# Patient Record
Sex: Female | Born: 1980 | Race: Asian | Hispanic: No | Marital: Married | Smoking: Never smoker
Health system: Southern US, Community
[De-identification: ages and names within clinical notes are randomized; demographics above are authoritative.]

## PROBLEM LIST (undated history)

## (undated) ENCOUNTER — Inpatient Hospital Stay (HOSPITAL_COMMUNITY): Payer: Self-pay

## (undated) DIAGNOSIS — IMO0002 Reserved for concepts with insufficient information to code with codable children: Secondary | ICD-10-CM

## (undated) DIAGNOSIS — Z789 Other specified health status: Secondary | ICD-10-CM

---

## 2011-08-06 ENCOUNTER — Encounter: Payer: Self-pay | Admitting: Obstetrics & Gynecology

## 2011-08-18 ENCOUNTER — Other Ambulatory Visit: Payer: Self-pay | Admitting: Obstetrics & Gynecology

## 2011-08-18 LAB — OB RESULTS CONSOLE HIV ANTIBODY (ROUTINE TESTING): HIV: NONREACTIVE

## 2011-08-18 LAB — OB RESULTS CONSOLE HEPATITIS B SURFACE ANTIGEN: Hepatitis B Surface Ag: NEGATIVE

## 2011-08-18 LAB — OB RESULTS CONSOLE RUBELLA ANTIBODY, IGM: Rubella: IMMUNE

## 2011-08-18 LAB — OB RESULTS CONSOLE RPR: RPR: NONREACTIVE

## 2011-08-26 LAB — OB RESULTS CONSOLE GC/CHLAMYDIA
Chlamydia: NEGATIVE
Gonorrhea: NEGATIVE

## 2011-09-30 ENCOUNTER — Ambulatory Visit (HOSPITAL_COMMUNITY): Payer: Managed Care, Other (non HMO)

## 2011-10-02 ENCOUNTER — Ambulatory Visit (HOSPITAL_COMMUNITY)
Admission: RE | Admit: 2011-10-02 | Discharge: 2011-10-02 | Disposition: A | Discharge status: Home / Self Care | Payer: Managed Care, Other (non HMO) | Source: Ambulatory Visit | Attending: Obstetrics & Gynecology | Admitting: Obstetrics & Gynecology

## 2011-10-02 NOTE — Progress Notes (Signed)
Genetic Counseling  High-Risk Gestation Note  Appointment Date:  10/02/2011 Referred By: Robley Fries, MD Date of Birth:  January 29, 1981 Attending: Particia Nearing, MD   [name] was seen for genetic counseling because of a family history of a genetic condition.     Both family histories were reviewed and found to be contributory for a genetic condition in the father of the pregnancy, Mr. Burson, paternal family history. Mr. Legault paternal uncle reportedly had partial alopecia and is otherwise healthy. Mr. Quesinberry paternal aunt had one daughter who passed away at age 71 years due to more severe symptoms from reportedly the same condition, and this same paternal aunt had another daughter who is reportedly unaffected. This daughter has one daughter, currently age 26 years with similar features to the female who died at age 56. 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 She is reported to not be expected to live past 5 years, though the patient did not know the reason. She has been evaluated at [facility], and research is reportedly still being conducted. The patient stated that she was told that this relative has a genetic condition that starts with "E" and is three letters total. She also reported that it was due to a missing chromosome. No additional relatives were reported with similar features including the father of the pregnancy and his father. We discussed that based on the description of features and the description of the possible name, the condition in the family may be EEC syndrome (ectrodactyly-ectodermal dysplasia-clefting syndrome). However, we discussed that part of the description does not necessarily fit with EEC  syndrome including the condition being cause by a missing chromosome and a reduced lifespan. Additional information is needed regarding the family history to confirm the diagnosis of the condition in the family and thus inheritance and recurrence risk for relatives.  EEC syndrome is characterized by limb malformations, ectodermal dysplasia (hypotrichosis and anodontia), and cleft lip with or without palate (in approximately 68% of patients). Specifically, limb malformations include defects in the midportion of hands and feet, varying from ectrodactyly to syndactyly (~84% of patients). Additional characteristics may include: defects of lacrimal duct system (~59% of patients), characteristic facial features, genitourinary anomalies (~52% of patients), and less commonly conductive hearing loss (14%). Individuals typically have normal intelligence, though developmental delay has been reported to occur in approximately 7% of individuals. The lifespan is not typically expected to be shortened in individuals with EEC syndrome.  Variable expressivity and reduced penetrance is reported in EEC.   EEC syndrome follows autosomal dominant inheritance. We reviewed chromosomes, genes, and various patterns of inheritance, including autosomal dominant inheritance. In autosomal dominant inheritance, an affected individual has a 1 in 2 (50%) chance to pass on the condition with each offspring, regardless of gender. If an individual does not inherit the causative gene change, then they would not be at increased risk to have a child with the autosomal dominant condition in the family. Reduced penetrance describes when an individual has the causative gene change for a particular condition but does not display symptoms. However, this person still has a 1 in 2 (50%) chance to pass on the causative gene change with each pregnancy. In the case of an autosomal dominant  condition with reduced penetrance as the cause for the features in  this reported family history, recurrence risk for the current pregnancy is approximately 1 in 8 (12.5%). This recurrence risk estimate may change in the case of additional information regarding the family history, such as a different condition in the family.   At least three types of EEC syndrome have been described, with at least 3 different gene loci identified. Clinical testing is available for one of these genes, p63, which is the causative gene for the majority of cases of EEC syndrome. We discussed that genetic testing is most informative when started with an affected relative, given that genetic testing cannot identify causative mutations in all individuals with a clinical diagnosis of a condition. In the case that a causative gene change is identified (or has already been identified), then genetic testing would be available to at-risk relatives such as the father of the pregnancy, for the familial mutation. The patient did not have information at this time regarding whether or not genetic testing had been performed for the father of the pregnancy's affected cousins.   We discussed that prenatal diagnosis would be available via amniocentesis, in the event that the genetic cause (specific gene and mutation) has been identified in the family and if the pregnancy is identified to be at risk to inherit the condition. The risks, benefits, and limitations of amniocentesis were reviewed. In the case of an autosomal dominant condition and in the case that a familial mutation is identified, genetic testing would then be available to the father of the pregnancy to further adjust recurrence risk to the current pregnancy. We discussed that amniocentesis would not be informative for the genetic condition in the family at this time without confirmation of the diagnosis in the affected individuals and without information regarding an identified causative mutation. We reviewed the option of targeted ultrasound to assess  fetal growth and development and to assess for possible features of the condition in the family such as ectrodactyly/syndactyly. The patient understands that ultrasound cannot diagnose or rule out all birth defects or genetic conditions, and that ultrasound would not rule out the presence of the condition present in Mr. Dutan paternal family. The patient elected to return on 10/17/11 for targeted ultrasound. She will contact our office if additional information is obtained regarding the specific condition in the family and whether or not genetic testing has been performed and identified a causative mutation. Additional testing may be available to the patient and/or her partner depending upon the additional information obtained regarding the family history. Without further information regarding the provided family history, an accurate genetic risk cannot be calculated. Further genetic counseling is warranted if more information is obtained.  Mrs. Heron denied exposure to environmental toxins or chemical agents. She denied the use of alcohol, tobacco or street drugs. She denied significant viral illnesses during the course of her pregnancy. Her medical and surgical histories were noncontributory.   I counseled [name] regarding the above risks and available options.  The approximate face-to-face time with the genetic counselor was 35 minutes.  Quinn Plowman, MS,  Certified [employer] 10/02/2011

## 2011-10-15 ENCOUNTER — Other Ambulatory Visit (HOSPITAL_COMMUNITY): Payer: Self-pay | Admitting: Obstetrics & Gynecology

## 2011-10-15 DIAGNOSIS — Z8489 Family history of other specified conditions: Secondary | ICD-10-CM

## 2011-10-17 ENCOUNTER — Ambulatory Visit (HOSPITAL_COMMUNITY)
Admission: RE | Admit: 2011-10-17 | Discharge: 2011-10-17 | Disposition: A | Discharge status: Home / Self Care | Payer: Managed Care, Other (non HMO) | Source: Ambulatory Visit | Attending: Obstetrics & Gynecology | Admitting: Obstetrics & Gynecology

## 2011-10-17 ENCOUNTER — Encounter (HOSPITAL_COMMUNITY): Payer: Self-pay

## 2011-10-17 DIAGNOSIS — Z8489 Family history of other specified conditions: Secondary | ICD-10-CM

## 2011-10-17 DIAGNOSIS — O352XX0 Maternal care for (suspected) hereditary disease in fetus, not applicable or unspecified: Secondary | ICD-10-CM | POA: Insufficient documentation

## 2011-10-17 DIAGNOSIS — O358XX0 Maternal care for other (suspected) fetal abnormality and damage, not applicable or unspecified: Secondary | ICD-10-CM | POA: Insufficient documentation

## 2011-10-17 DIAGNOSIS — Z1389 Encounter for screening for other disorder: Secondary | ICD-10-CM | POA: Insufficient documentation

## 2011-10-17 DIAGNOSIS — Z363 Encounter for antenatal screening for malformations: Secondary | ICD-10-CM | POA: Insufficient documentation

## 2012-01-08 ENCOUNTER — Encounter (HOSPITAL_COMMUNITY): Payer: Self-pay | Admitting: *Deleted

## 2012-01-08 ENCOUNTER — Inpatient Hospital Stay (HOSPITAL_COMMUNITY)
Admission: AD | Admit: 2012-01-08 | Discharge: 2012-01-08 | Disposition: A | Discharge status: Home / Self Care | Payer: Managed Care, Other (non HMO) | Source: Ambulatory Visit | Attending: Obstetrics | Admitting: Obstetrics

## 2012-01-08 DIAGNOSIS — O99891 Other specified diseases and conditions complicating pregnancy: Secondary | ICD-10-CM | POA: Insufficient documentation

## 2012-01-08 DIAGNOSIS — R51 Headache: Secondary | ICD-10-CM | POA: Insufficient documentation

## 2012-01-08 DIAGNOSIS — R109 Unspecified abdominal pain: Secondary | ICD-10-CM | POA: Insufficient documentation

## 2012-01-08 HISTORY — DX: Other specified health status: Z78.9

## 2012-01-08 LAB — URINALYSIS, ROUTINE W REFLEX MICROSCOPIC
Glucose, UA: NEGATIVE mg/dL
Hgb urine dipstick: NEGATIVE
Specific Gravity, Urine: 1.01 (ref 1.005–1.030)
pH: 7.5 (ref 5.0–8.0)

## 2012-01-08 NOTE — MAU Note (Signed)
PT SAYS   SHE HAD H/A ON Tuesday - AT DR OFFICE- TOLD NURSE.   H/A SAME  ON WED.   HAS SAME H/A NOW.

## 2012-01-08 NOTE — MAU Note (Signed)
PT SAYS SHE STARTED HURTING  AT - NOT TOO BAD- SO WENT TO SLEEP- FEELS CRAMPS  WHEN GOING TO  B-ROOM. WHEN AWOKE AT 5AM-  PAIN IS WORSE.Marland Kitchen DENIES HSV AND MRSA.

## 2012-01-08 NOTE — H&P (Signed)
CC: ctx HPI: 31 yo G1 at 30'0 presents with ctx starting around midnight. Pt notes pain in the vagina when she would wake at night to void. Pt states voids 5-6 times per night. No dysuria. At 4 am the pain got worse. Baby moving well. No VB, no LOF, no vaginal d/c. Pain has gotten better since arrival to MAU. Pt notes still getting waves of sharp pain in the vagina lasting 10 seconds.  Meds: PNV All: NKDA PSH: none PMH: none  PE: Filed Vitals:   01/08/12 0556  BP: 105/67  Pulse: 94  Temp: 98.8 F (37.1 C)  TempSrc: Oral  Resp: 20  Height: 5\' 4"  (1.626 m)  Weight: 70.024 kg (154 lb 6 oz)    Toco: none FH: 140's, + accels, no decels, 10 beat variabilitily Gen: no distress CV: Regular rate and rhythm Pulmonary: Clear to auscultation bilaterally Back: No costovertebral angle tenderness Abdomen: No right upper quadrant pain gravid, fundus nontender, no rebound, no guarding, Lower extremities: No edema GU: Normal-appearing mons and external genitalia, normal vagina, small amounts of clear white vaginal discharge, cervix nontender very posterior, soft but closed and long, minimal bladder tenderness, no uterine tenderness, no adnexal masses or adnexal tenderness.  Wet prep: Multiple lactobacilli, normal epithelial cells, no yeast, no bacterial vaginosis, no trichomoniasis, no sperm, no white blood cell  Assessment and plan: This is a 31 year old G1 at 30 weeks with complaints of vaginal pain but no evidence of vaginal infection, UTI, preterm labor, rupture of membranes, patient does note her pain has improved since being in MAU. At this point there is no clear etiology for her vaginal pain, possible causes include vaginal or vulvar varicosities though they were not readily apparent on exam today versus musculoskeletal pain. Patient was given reassurance that there are no worrisome findings on exam and was discharged to home. She was instructed to followup with any signs or symptoms of labor  nonreassuring fetal testing around her of membranes.  Florella Mcneese A. 01/08/2012 7:51 AM

## 2012-01-08 NOTE — MAU Note (Signed)
Patient states she does not want any pain medication for the headache. "I'm ok" she states,

## 2012-01-10 LAB — URINE CULTURE
Colony Count: >=100000
Culture  Setup Time: 201306070118
Special Requests: NORMAL

## 2012-02-16 ENCOUNTER — Ambulatory Visit (INDEPENDENT_AMBULATORY_CARE_PROVIDER_SITE_OTHER): Payer: Managed Care, Other (non HMO) | Admitting: Pediatrics

## 2012-02-16 DIAGNOSIS — Z7681 Expectant parent(s) prebirth pediatrician visit: Secondary | ICD-10-CM

## 2012-02-27 ENCOUNTER — Inpatient Hospital Stay (HOSPITAL_COMMUNITY)
Admission: AD | Admit: 2012-02-27 | Discharge: 2012-02-27 | Disposition: A | Discharge status: Home / Self Care | Payer: Managed Care, Other (non HMO) | Source: Ambulatory Visit | Attending: Obstetrics and Gynecology | Admitting: Obstetrics and Gynecology

## 2012-02-27 DIAGNOSIS — O479 False labor, unspecified: Secondary | ICD-10-CM | POA: Insufficient documentation

## 2012-02-27 NOTE — MAU Note (Signed)
Ctx during day.  Stopped and then started again around 4.  Now every 10-41min.  Panties were wet this morning. Wetness continued all day. No bleeding.

## 2012-02-27 NOTE — MAU Note (Signed)
Dr Billy Coast in to see pt and talk with her and s/o.  Pt to be discharged to home.

## 2012-02-27 NOTE — MAU Note (Signed)
Pt presents with contract 10-20 min apart since 1500.  Pt denies leakage of fluid, bleeding, PIH symptoms.  + FM per pt.

## 2012-02-27 NOTE — MAU Provider Note (Signed)
  History   Irregular contractions  CSN: [id number]  Arrival date and time: 02/27/12 1841   None     Chief Complaint  Patient presents with  . Labor Eval   HPI  OB History    Grav Para Term Preterm Abortions TAB SAB Ect Mult Living   1               Past Medical History  Diagnosis Date  . No pertinent past medical history     No past surgical history on file.  No family history on file.  History  Substance Use Topics  . Smoking status: Never Smoker   . Smokeless tobacco: Not on file  . Alcohol Use: No    Allergies: No Known Allergies  Prescriptions prior to admission  Medication Sig Dispense Refill  . ferrous sulfate 325 (65 FE) MG tablet Take 325 mg by mouth daily with breakfast.      . Prenatal Vit-Fe Fumarate-FA (PRENATAL MULTIVITAMIN) TABS Take 1 tablet by mouth at bedtime.        ROS otherwise negative Physical Exam: See dictated note   Blood pressure 112/67, pulse 119, temperature 98.5 F (36.9 C), temperature source Oral, resp. rate 20, height 5\' 3"  (1.6 m), weight 76.658 kg (169 lb), last menstrual period 06/12/2011.  Physical Exam: dictated  MAU Course  Procedures  MDM na  Assessment and Plan  37 weeks IUP Prodromal Labor Labor precautions.  Tequilla Cousineau J 02/27/2012, 8:32 PM

## 2012-02-28 NOTE — H&P (Signed)
NAMEBRITANY, Diana Mullen                  ACCOUNT NO.:  [id number]  MEDICAL RECORD NO.:  [id number]  LOCATION:  ZO10                          FACILITY:  [facility]  PHYSICIAN:  Lenoard Aden, M.D.DATE OF BIRTH:  Jan 18, 1981  DATE OF ADMISSION:  02/27/2012 DATE OF DISCHARGE:  02/27/2012                             HISTORY & PHYSICAL   CHIEF COMPLAINT:  Irregular contractions.  HISTORY OF PRESENT ILLNESS:  She is a 31 year old [place] female, G1, P0, at 48 and 1/7th weeks' gestation who presents with a complaint of contractions 10-20 minutes apart.  She denies leakage of fluid.  She reports good fetal movement.  ALLERGIES:  She has no known drug allergies.  MEDICATIONS:  Prenatal vitamins and Slow Fe.  FAMILY HISTORY:  She has a family history of hypertension.  SOCIAL HISTORY:  She is a nonsmoker, nondrinker.  She denies domestic or physical violence.  PAST SURGICAL HISTORY:  She has a noncontributory surgical history.  OBSTETRIC HISTORY:  She has a noncontributory obstetric history.  Her prenatal course to date has been uncomplicated.  PHYSICAL EXAMINATION:  GENERAL:  She is a well-developed, well-nourished [place] female in no acute distress. HEENT:  Normal. NECK:  Supple.  Full range of motion. LUNGS:  Clear. HEART:  Regular rate and rhythm. ABDOMEN:  Soft, gravid, nontender.  No CVA tenderness. EXTREMITIES:  No cords. NEUROLOGIC:  Nonfocal. SKIN:  Intact. PELVIC:  Per RN close defect -2.  NST is reactive with category 1 tracing.  Fetal heart rate tones 130-150 with good accelerations.  No decelerations.  Rare contractions are noted.  IMPRESSION:  Prodromal labor pattern at 37 weeks.  PLAN:  Discharged home.  Labor precautions given.     Lenoard Aden, M.D.     RJT/MEDQ  D:  02/27/2012  T:  02/28/2012  Job:  [id number]

## 2012-03-06 ENCOUNTER — Inpatient Hospital Stay (HOSPITAL_COMMUNITY)
Admission: AD | Admit: 2012-03-06 | Discharge: 2012-03-06 | Disposition: A | Discharge status: Home / Self Care | Payer: Managed Care, Other (non HMO) | Source: Ambulatory Visit | Attending: Obstetrics & Gynecology | Admitting: Obstetrics & Gynecology

## 2012-03-06 ENCOUNTER — Encounter (HOSPITAL_COMMUNITY): Payer: Self-pay | Admitting: *Deleted

## 2012-03-06 ENCOUNTER — Inpatient Hospital Stay (HOSPITAL_COMMUNITY)
Admission: AD | Admit: 2012-03-06 | Discharge: 2012-03-10 | DRG: 765 | Disposition: A | Discharge status: Home / Self Care | Payer: Managed Care, Other (non HMO) | Source: Ambulatory Visit | Attending: Obstetrics & Gynecology | Admitting: Obstetrics & Gynecology

## 2012-03-06 DIAGNOSIS — O479 False labor, unspecified: Secondary | ICD-10-CM | POA: Insufficient documentation

## 2012-03-06 DIAGNOSIS — O9903 Anemia complicating the puerperium: Secondary | ICD-10-CM | POA: Diagnosis not present

## 2012-03-06 DIAGNOSIS — D62 Acute posthemorrhagic anemia: Secondary | ICD-10-CM | POA: Diagnosis not present

## 2012-03-06 DIAGNOSIS — IMO0001 Reserved for inherently not codable concepts without codable children: Secondary | ICD-10-CM

## 2012-03-06 DIAGNOSIS — IMO0002 Reserved for concepts with insufficient information to code with codable children: Secondary | ICD-10-CM

## 2012-03-06 DIAGNOSIS — O409XX0 Polyhydramnios, unspecified trimester, not applicable or unspecified: Principal | ICD-10-CM | POA: Diagnosis present

## 2012-03-06 HISTORY — DX: Reserved for concepts with insufficient information to code with codable children: IMO0002

## 2012-03-06 LAB — CBC
HCT: 35.2 % — ABNORMAL LOW (ref 36.0–46.0)
MCH: 29.9 pg (ref 26.0–34.0)
MCV: 91.7 fL (ref 78.0–100.0)
RBC: 3.84 MIL/uL — ABNORMAL LOW (ref 3.87–5.11)
WBC: 14.5 10*3/uL — ABNORMAL HIGH (ref 4.0–10.5)

## 2012-03-06 MED ORDER — PHENYLEPHRINE 40 MCG/ML (10ML) SYRINGE FOR IV PUSH (FOR BLOOD PRESSURE SUPPORT): 80.0000 ug | PREFILLED_SYRINGE | INTRAVENOUS | Status: DC | PRN

## 2012-03-06 MED ORDER — FLEET ENEMA 7-19 GM/118ML RE ENEM: 1.0000 | ENEMA | RECTAL | Status: DC | PRN

## 2012-03-06 MED ORDER — FENTANYL 2.5 MCG/ML BUPIVACAINE 1/10 % EPIDURAL INFUSION (WH - ANES)
14.0000 mL/h | INTRAMUSCULAR | Status: DC
  Administered 2012-03-07 (×4): 14 mL/h via EPIDURAL
  Filled 2012-03-06 (×4): qty 60

## 2012-03-06 MED ORDER — CITRIC ACID-SODIUM CITRATE 334-500 MG/5ML PO SOLN
30.0000 mL | ORAL | Status: DC | PRN
  Administered 2012-03-07: 30 mL via ORAL
  Filled 2012-03-06: qty 15

## 2012-03-06 MED ORDER — BUTORPHANOL TARTRATE 1 MG/ML IJ SOLN: 1.0000 mg | Freq: Once | INTRAMUSCULAR | Status: DC

## 2012-03-06 MED ORDER — DIPHENHYDRAMINE HCL 50 MG/ML IJ SOLN
12.5000 mg | INTRAMUSCULAR | Status: DC | PRN
  Administered 2012-03-07: 12.5 mg via INTRAVENOUS
  Filled 2012-03-06: qty 1

## 2012-03-06 MED ORDER — EPHEDRINE 5 MG/ML INJ: 10.0000 mg | INTRAVENOUS | Status: DC | PRN

## 2012-03-06 MED ORDER — LACTATED RINGERS IV SOLN
INTRAVENOUS | Status: DC
  Administered 2012-03-07: 01:00:00 via INTRAVENOUS
  Administered 2012-03-07: 125 mL/h via INTRAVENOUS

## 2012-03-06 MED ORDER — OXYTOCIN 40 UNITS IN LACTATED RINGERS INFUSION - SIMPLE MED: 62.5000 mL/h | Freq: Once | INTRAVENOUS | Status: DC

## 2012-03-06 MED ORDER — PHENYLEPHRINE 40 MCG/ML (10ML) SYRINGE FOR IV PUSH (FOR BLOOD PRESSURE SUPPORT)
80.0000 ug | PREFILLED_SYRINGE | INTRAVENOUS | Status: DC | PRN
  Filled 2012-03-06: qty 5

## 2012-03-06 MED ORDER — EPHEDRINE 5 MG/ML INJ
10.0000 mg | INTRAVENOUS | Status: DC | PRN
  Filled 2012-03-06: qty 4

## 2012-03-06 MED ORDER — ACETAMINOPHEN 325 MG PO TABS: 650.0000 mg | ORAL_TABLET | ORAL | Status: DC | PRN

## 2012-03-06 MED ORDER — OXYTOCIN BOLUS FROM INFUSION
250.0000 mL | Freq: Once | INTRAVENOUS | Status: DC
  Filled 2012-03-06: qty 500

## 2012-03-06 MED ORDER — OXYCODONE-ACETAMINOPHEN 5-325 MG PO TABS: 1.0000 | ORAL_TABLET | ORAL | Status: DC | PRN

## 2012-03-06 MED ORDER — IBUPROFEN 600 MG PO TABS: 600.0000 mg | ORAL_TABLET | Freq: Four times a day (QID) | ORAL | Status: DC | PRN

## 2012-03-06 MED ORDER — LACTATED RINGERS IV SOLN: 500.0000 mL | Freq: Once | INTRAVENOUS | Status: DC

## 2012-03-06 MED ORDER — ONDANSETRON HCL 4 MG/2ML IJ SOLN: 4.0000 mg | Freq: Four times a day (QID) | INTRAMUSCULAR | Status: DC | PRN

## 2012-03-06 MED ORDER — LIDOCAINE HCL (PF) 1 % IJ SOLN: 30.0000 mL | INTRAMUSCULAR | Status: DC | PRN

## 2012-03-06 MED ORDER — LACTATED RINGERS IV SOLN
500.0000 mL | INTRAVENOUS | Status: DC | PRN
  Administered 2012-03-06: 1000 mL via INTRAVENOUS

## 2012-03-06 NOTE — MAU Note (Signed)
Pt states, " I've had contractions all day but stronger and closer for two hours."

## 2012-03-06 NOTE — MAU Note (Signed)
Pt requested Dr Juliene Pina be called for her care. Dr Mody's cell called and message left as to pt's arrival and status. Pt here earlier today and RN spoke with Dr Juliene Pina about pt. Dr Juliene Pina stated she knew pt and was ok to call her about pt. Pt was d/c home after that visit.

## 2012-03-06 NOTE — Progress Notes (Signed)
Dr mody notified of patient, her comfortable, tracing, ctx pattern, sve result. Order to administer 1mg  stadol im, then she may go home in or she may be discharged now.

## 2012-03-06 NOTE — Progress Notes (Signed)
Dr Seymour Bars called again and notified of pt's admission and status. Will admit to BS.

## 2012-03-06 NOTE — MAU Note (Signed)
Patient is in for a labor eval. She is ctx q4-8mins. Denies any vaginal bleeding or lof. She reports good fetal movement.

## 2012-03-06 NOTE — Progress Notes (Signed)
Dr Seymour Bars called and message left to return call to MAU

## 2012-03-06 NOTE — MAU Note (Signed)
Dr Juliene Pina called in and will assume care of pt.

## 2012-03-07 ENCOUNTER — Encounter (HOSPITAL_COMMUNITY): Payer: Self-pay | Admitting: *Deleted

## 2012-03-07 ENCOUNTER — Encounter (HOSPITAL_COMMUNITY)
Admission: AD | Disposition: A | Discharge status: Home / Self Care | Payer: Self-pay | Source: Ambulatory Visit | Attending: Obstetrics & Gynecology

## 2012-03-07 ENCOUNTER — Encounter (HOSPITAL_COMMUNITY): Payer: Self-pay | Admitting: Anesthesiology

## 2012-03-07 ENCOUNTER — Inpatient Hospital Stay (HOSPITAL_COMMUNITY): Payer: Managed Care, Other (non HMO) | Admitting: Anesthesiology

## 2012-03-07 DIAGNOSIS — IMO0001 Reserved for inherently not codable concepts without codable children: Secondary | ICD-10-CM

## 2012-03-07 SURGERY — Surgical Case
Anesthesia: Epidural | Site: Abdomen | Wound class: Clean Contaminated

## 2012-03-07 MED ORDER — NALOXONE HCL 0.4 MG/ML IJ SOLN: 0.4000 mg | INTRAMUSCULAR | Status: DC | PRN

## 2012-03-07 MED ORDER — SODIUM CHLORIDE 0.9 % IV SOLN: 1.0000 ug/kg/h | INTRAVENOUS | Status: DC | PRN

## 2012-03-07 MED ORDER — PHENYLEPHRINE HCL 10 MG/ML IJ SOLN
INTRAMUSCULAR | Status: DC | PRN
  Administered 2012-03-07 (×2): 40 ug via INTRAVENOUS
  Administered 2012-03-07: 80 ug via INTRAVENOUS
  Administered 2012-03-07: 40 ug via INTRAVENOUS
  Administered 2012-03-07: 80 ug via INTRAVENOUS
  Administered 2012-03-07: 40 ug via INTRAVENOUS
  Administered 2012-03-07: 80 ug via INTRAVENOUS

## 2012-03-07 MED ORDER — SODIUM CHLORIDE 0.9 % IR SOLN
Status: DC | PRN
  Administered 2012-03-07: 1000 mL

## 2012-03-07 MED ORDER — MEPERIDINE HCL 25 MG/ML IJ SOLN: 6.2500 mg | INTRAMUSCULAR | Status: DC | PRN

## 2012-03-07 MED ORDER — MORPHINE SULFATE (PF) 0.5 MG/ML IJ SOLN
INTRAMUSCULAR | Status: DC | PRN
  Administered 2012-03-07: 1 mg via INTRAVENOUS
  Administered 2012-03-07: 4 mg via EPIDURAL

## 2012-03-07 MED ORDER — WITCH HAZEL-GLYCERIN EX PADS: 1.0000 "application " | MEDICATED_PAD | CUTANEOUS | Status: DC | PRN

## 2012-03-07 MED ORDER — CEFAZOLIN SODIUM-DEXTROSE 2-3 GM-% IV SOLR
INTRAVENOUS | Status: AC
  Filled 2012-03-07: qty 50

## 2012-03-07 MED ORDER — FENTANYL CITRATE 0.05 MG/ML IJ SOLN
INTRAMUSCULAR | Status: DC | PRN
  Administered 2012-03-07 (×2): 50 ug via INTRAVENOUS

## 2012-03-07 MED ORDER — CEFAZOLIN SODIUM-DEXTROSE 2-3 GM-% IV SOLR
2.0000 g | INTRAVENOUS | Status: DC
  Administered 2012-03-07: 2 g via INTRAVENOUS

## 2012-03-07 MED ORDER — LIDOCAINE HCL (PF) 1 % IJ SOLN
INTRAMUSCULAR | Status: DC | PRN
  Administered 2012-03-07 (×3): 4 mL

## 2012-03-07 MED ORDER — MEPERIDINE HCL 25 MG/ML IJ SOLN
INTRAMUSCULAR | Status: DC | PRN
  Administered 2012-03-07 (×2): 12.5 mg via INTRAVENOUS

## 2012-03-07 MED ORDER — KETOROLAC TROMETHAMINE 30 MG/ML IJ SOLN: 30.0000 mg | Freq: Four times a day (QID) | INTRAMUSCULAR | Status: AC | PRN

## 2012-03-07 MED ORDER — ONDANSETRON HCL 4 MG/2ML IJ SOLN
INTRAMUSCULAR | Status: AC
  Filled 2012-03-07: qty 2

## 2012-03-07 MED ORDER — SODIUM CHLORIDE 0.9 % IJ SOLN: 3.0000 mL | INTRAMUSCULAR | Status: DC | PRN

## 2012-03-07 MED ORDER — KETOROLAC TROMETHAMINE 30 MG/ML IJ SOLN
30.0000 mg | Freq: Four times a day (QID) | INTRAMUSCULAR | Status: AC | PRN
  Administered 2012-03-07: 30 mg via INTRAVENOUS

## 2012-03-07 MED ORDER — OXYTOCIN 10 UNIT/ML IJ SOLN
40.0000 [IU] | INTRAVENOUS | Status: DC | PRN
  Administered 2012-03-07: 40 [IU] via INTRAVENOUS

## 2012-03-07 MED ORDER — NALBUPHINE HCL 10 MG/ML IJ SOLN
5.0000 mg | INTRAMUSCULAR | Status: DC | PRN
  Filled 2012-03-07: qty 1

## 2012-03-07 MED ORDER — DIPHENHYDRAMINE HCL 25 MG PO CAPS: 25.0000 mg | ORAL_CAPSULE | Freq: Four times a day (QID) | ORAL | Status: DC | PRN

## 2012-03-07 MED ORDER — SIMETHICONE 80 MG PO CHEW
80.0000 mg | CHEWABLE_TABLET | ORAL | Status: DC | PRN
  Administered 2012-03-09: 80 mg via ORAL

## 2012-03-07 MED ORDER — SODIUM BICARBONATE 8.4 % IV SOLN
INTRAVENOUS | Status: AC
  Filled 2012-03-07: qty 50

## 2012-03-07 MED ORDER — OXYTOCIN 10 UNIT/ML IJ SOLN
INTRAMUSCULAR | Status: AC
  Filled 2012-03-07: qty 4

## 2012-03-07 MED ORDER — OXYCODONE-ACETAMINOPHEN 5-325 MG PO TABS
1.0000 | ORAL_TABLET | ORAL | Status: DC | PRN
  Administered 2012-03-08: 2 via ORAL
  Administered 2012-03-08 (×2): 1 via ORAL
  Administered 2012-03-09: 2 via ORAL
  Administered 2012-03-09: 1 via ORAL
  Administered 2012-03-09: 2 via ORAL
  Administered 2012-03-10: 1 via ORAL
  Filled 2012-03-07: qty 2
  Filled 2012-03-07: qty 1
  Filled 2012-03-07 (×2): qty 2
  Filled 2012-03-07 (×3): qty 1

## 2012-03-07 MED ORDER — ONDANSETRON HCL 4 MG/2ML IJ SOLN
4.0000 mg | INTRAMUSCULAR | Status: DC | PRN
  Administered 2012-03-07: 4 mg via INTRAVENOUS
  Filled 2012-03-07: qty 2

## 2012-03-07 MED ORDER — DIPHENHYDRAMINE HCL 50 MG/ML IJ SOLN: 12.5000 mg | INTRAMUSCULAR | Status: DC | PRN

## 2012-03-07 MED ORDER — DIPHENHYDRAMINE HCL 25 MG PO CAPS
25.0000 mg | ORAL_CAPSULE | ORAL | Status: DC | PRN
  Administered 2012-03-08: 25 mg via ORAL
  Filled 2012-03-07: qty 1

## 2012-03-07 MED ORDER — TERBUTALINE SULFATE 1 MG/ML IJ SOLN: 0.2500 mg | Freq: Once | INTRAMUSCULAR | Status: DC | PRN

## 2012-03-07 MED ORDER — ZOLPIDEM TARTRATE 5 MG PO TABS: 5.0000 mg | ORAL_TABLET | Freq: Every evening | ORAL | Status: DC | PRN

## 2012-03-07 MED ORDER — TETANUS-DIPHTH-ACELL PERTUSSIS 5-2.5-18.5 LF-MCG/0.5 IM SUSP: 0.5000 mL | Freq: Once | INTRAMUSCULAR | Status: DC

## 2012-03-07 MED ORDER — LACTATED RINGERS IV SOLN
INTRAVENOUS | Status: DC
  Administered 2012-03-07 – 2012-03-08 (×2): via INTRAVENOUS

## 2012-03-07 MED ORDER — FENTANYL 2.5 MCG/ML BUPIVACAINE 1/10 % EPIDURAL INFUSION (WH - ANES): 14.0000 mL/h | INTRAMUSCULAR | Status: DC

## 2012-03-07 MED ORDER — OXYTOCIN 40 UNITS IN LACTATED RINGERS INFUSION - SIMPLE MED
1.0000 m[IU]/min | INTRAVENOUS | Status: DC
  Administered 2012-03-07: 1 m[IU]/min via INTRAVENOUS
  Filled 2012-03-07: qty 1000

## 2012-03-07 MED ORDER — ONDANSETRON HCL 4 MG/2ML IJ SOLN
INTRAMUSCULAR | Status: DC | PRN
  Administered 2012-03-07: 4 mg via INTRAVENOUS

## 2012-03-07 MED ORDER — IBUPROFEN 600 MG PO TABS
600.0000 mg | ORAL_TABLET | Freq: Four times a day (QID) | ORAL | Status: DC | PRN
  Filled 2012-03-07 (×5): qty 1

## 2012-03-07 MED ORDER — KETOROLAC TROMETHAMINE 30 MG/ML IJ SOLN
INTRAMUSCULAR | Status: AC
  Administered 2012-03-07: 30 mg via INTRAVENOUS
  Filled 2012-03-07: qty 1

## 2012-03-07 MED ORDER — LANOLIN HYDROUS EX OINT: 1.0000 "application " | TOPICAL_OINTMENT | CUTANEOUS | Status: DC | PRN

## 2012-03-07 MED ORDER — PHENYLEPHRINE 40 MCG/ML (10ML) SYRINGE FOR IV PUSH (FOR BLOOD PRESSURE SUPPORT)
PREFILLED_SYRINGE | INTRAVENOUS | Status: AC
  Filled 2012-03-07: qty 10

## 2012-03-07 MED ORDER — LACTATED RINGERS IV SOLN
INTRAVENOUS | Status: DC | PRN
  Administered 2012-03-07: 15:00:00 via INTRAVENOUS

## 2012-03-07 MED ORDER — FENTANYL CITRATE 0.05 MG/ML IJ SOLN: 25.0000 ug | INTRAMUSCULAR | Status: DC | PRN

## 2012-03-07 MED ORDER — DIBUCAINE 1 % RE OINT: 1.0000 "application " | TOPICAL_OINTMENT | RECTAL | Status: DC | PRN

## 2012-03-07 MED ORDER — LIDOCAINE-EPINEPHRINE (PF) 2 %-1:200000 IJ SOLN
INTRAMUSCULAR | Status: AC
  Filled 2012-03-07: qty 20

## 2012-03-07 MED ORDER — PRENATAL MULTIVITAMIN CH
1.0000 | ORAL_TABLET | Freq: Every day | ORAL | Status: DC
  Administered 2012-03-08 – 2012-03-10 (×3): 1 via ORAL
  Filled 2012-03-07 (×3): qty 1

## 2012-03-07 MED ORDER — ONDANSETRON HCL 4 MG PO TABS: 4.0000 mg | ORAL_TABLET | ORAL | Status: DC | PRN

## 2012-03-07 MED ORDER — OXYTOCIN 40 UNITS IN LACTATED RINGERS INFUSION - SIMPLE MED: 62.5000 mL/h | INTRAVENOUS | Status: AC

## 2012-03-07 MED ORDER — SENNOSIDES-DOCUSATE SODIUM 8.6-50 MG PO TABS
2.0000 | ORAL_TABLET | Freq: Every day | ORAL | Status: DC
  Administered 2012-03-08 – 2012-03-09 (×2): 2 via ORAL

## 2012-03-07 MED ORDER — DIPHENHYDRAMINE HCL 50 MG/ML IJ SOLN: 25.0000 mg | INTRAMUSCULAR | Status: DC | PRN

## 2012-03-07 MED ORDER — MEPERIDINE HCL 25 MG/ML IJ SOLN
INTRAMUSCULAR | Status: AC
  Filled 2012-03-07: qty 1

## 2012-03-07 MED ORDER — METOCLOPRAMIDE HCL 5 MG/ML IJ SOLN: 10.0000 mg | Freq: Three times a day (TID) | INTRAMUSCULAR | Status: DC | PRN

## 2012-03-07 MED ORDER — MENTHOL 3 MG MT LOZG: 1.0000 | LOZENGE | OROMUCOSAL | Status: DC | PRN

## 2012-03-07 MED ORDER — SCOPOLAMINE 1 MG/3DAYS TD PT72
1.0000 | MEDICATED_PATCH | Freq: Once | TRANSDERMAL | Status: AC
  Administered 2012-03-07: 1.5 mg via TRANSDERMAL

## 2012-03-07 MED ORDER — SODIUM BICARBONATE 8.4 % IV SOLN
INTRAVENOUS | Status: DC | PRN
  Administered 2012-03-07: 5 mL via EPIDURAL

## 2012-03-07 MED ORDER — MORPHINE SULFATE 0.5 MG/ML IJ SOLN
INTRAMUSCULAR | Status: AC
  Filled 2012-03-07: qty 10

## 2012-03-07 MED ORDER — ONDANSETRON HCL 4 MG/2ML IJ SOLN: 4.0000 mg | Freq: Three times a day (TID) | INTRAMUSCULAR | Status: DC | PRN

## 2012-03-07 MED ORDER — SIMETHICONE 80 MG PO CHEW
80.0000 mg | CHEWABLE_TABLET | Freq: Three times a day (TID) | ORAL | Status: DC
  Administered 2012-03-08 – 2012-03-10 (×9): 80 mg via ORAL

## 2012-03-07 MED ORDER — SCOPOLAMINE 1 MG/3DAYS TD PT72
MEDICATED_PATCH | TRANSDERMAL | Status: AC
  Administered 2012-03-07: 1.5 mg via TRANSDERMAL
  Filled 2012-03-07: qty 1

## 2012-03-07 MED ORDER — IBUPROFEN 600 MG PO TABS
600.0000 mg | ORAL_TABLET | Freq: Four times a day (QID) | ORAL | Status: DC
  Administered 2012-03-08 – 2012-03-10 (×11): 600 mg via ORAL
  Filled 2012-03-07 (×6): qty 1

## 2012-03-07 MED ORDER — LACTATED RINGERS IV SOLN
INTRAVENOUS | Status: DC | PRN
  Administered 2012-03-07 (×3): via INTRAVENOUS

## 2012-03-07 MED ORDER — FENTANYL CITRATE 0.05 MG/ML IJ SOLN
INTRAMUSCULAR | Status: AC
  Filled 2012-03-07: qty 2

## 2012-03-07 SURGICAL SUPPLY — 40 items
BENZOIN TINCTURE PRP APPL 2/3 (GAUZE/BANDAGES/DRESSINGS) ×4 IMPLANT
CHLORAPREP W/TINT 26ML (MISCELLANEOUS) ×2 IMPLANT
CLOTH BEACON ORANGE TIMEOUT ST (SAFETY) ×2 IMPLANT
CONTAINER PREFILL 10% NBF 15ML (MISCELLANEOUS) IMPLANT
DRESSING TELFA 8X3 (GAUZE/BANDAGES/DRESSINGS) ×2 IMPLANT
DRSG COVADERM 4X10 (GAUZE/BANDAGES/DRESSINGS) ×2 IMPLANT
ELECT REM PT RETURN 9FT ADLT (ELECTROSURGICAL) ×2
ELECTRODE REM PT RTRN 9FT ADLT (ELECTROSURGICAL) ×1 IMPLANT
EXTRACTOR VACUUM KIWI (MISCELLANEOUS) IMPLANT
EXTRACTOR VACUUM M CUP 4 TUBE (SUCTIONS) IMPLANT
GAUZE SPONGE 4X4 12PLY STRL LF (GAUZE/BANDAGES/DRESSINGS) IMPLANT
GLOVE BIO SURGEON STRL SZ7 (GLOVE) ×2 IMPLANT
GLOVE BIOGEL PI IND STRL 7.0 (GLOVE) ×2 IMPLANT
GLOVE BIOGEL PI INDICATOR 7.0 (GLOVE) ×2
GOWN PREVENTION PLUS LG XLONG (DISPOSABLE) ×6 IMPLANT
KIT ABG SYR 3ML LUER SLIP (SYRINGE) IMPLANT
NEEDLE HYPO 25X5/8 SAFETYGLIDE (NEEDLE) IMPLANT
NS IRRIG 1000ML POUR BTL (IV SOLUTION) ×2 IMPLANT
PACK C SECTION WH (CUSTOM PROCEDURE TRAY) ×2 IMPLANT
PAD ABD 7.5X8 STRL (GAUZE/BANDAGES/DRESSINGS) ×2 IMPLANT
RTRCTR C-SECT PINK 25CM LRG (MISCELLANEOUS) IMPLANT
SLEEVE SCD COMPRESS KNEE MED (MISCELLANEOUS) ×2 IMPLANT
STAPLER VISISTAT 35W (STAPLE) IMPLANT
STRIP CLOSURE SKIN 1/2X4 (GAUZE/BANDAGES/DRESSINGS) ×2 IMPLANT
STRIP CLOSURE SKIN 1/4X4 (GAUZE/BANDAGES/DRESSINGS) ×2 IMPLANT
SUT MNCRL 0 VIOLET CTX 36 (SUTURE) ×3 IMPLANT
SUT MONOCRYL 0 CTX 36 (SUTURE) ×3
SUT PLAIN 0 NONE (SUTURE) IMPLANT
SUT PLAIN 2 0 (SUTURE)
SUT PLAIN ABS 2-0 CT1 27XMFL (SUTURE) IMPLANT
SUT VIC AB 0 CT1 27 (SUTURE) ×2
SUT VIC AB 0 CT1 27XBRD ANBCTR (SUTURE) ×2 IMPLANT
SUT VIC AB 2-0 CT1 27 (SUTURE) ×2
SUT VIC AB 2-0 CT1 TAPERPNT 27 (SUTURE) ×2 IMPLANT
SUT VIC AB 4-0 KS 27 (SUTURE) ×2 IMPLANT
SUT VICRYL 0 TIES 12 18 (SUTURE) IMPLANT
TAPE CLOTH SURG 4X10 WHT LF (GAUZE/BANDAGES/DRESSINGS) ×2 IMPLANT
TOWEL OR 17X24 6PK STRL BLUE (TOWEL DISPOSABLE) ×4 IMPLANT
TRAY FOLEY CATH 14FR (SET/KITS/TRAYS/PACK) IMPLANT
WATER STERILE IRR 1000ML POUR (IV SOLUTION) ×2 IMPLANT

## 2012-03-07 NOTE — Progress Notes (Signed)
In O.R. 

## 2012-03-07 NOTE — Progress Notes (Signed)
Diana Mullen is a 31 y.o. G1P0 at [redacted]w[redacted]d, active labor with augmentation with pitocin. Pt comfortable with intermittent lack/rectal pressure. Was 9 cm at 9.45 am.   Objective: BP 100/66  Pulse 104  Temp 99.4 F (37.4 C) (Oral)  Resp 20  Ht 5\' 3"  (1.6 m)  Wt 170 lb (77.111 kg)  BMI 30.11 kg/m2  SpO2 100%  LMP 06/12/2011 I/O last 3 completed shifts: In: -  Out: 900 [Urine:900] Total I/O In: -  Out: 800 [Urine:800]  FHT:  FHR: 145 bpm, variability: minimal ,  accelerations:  Present,  decelerations:  Present intermittent variable decels with UCs, but quick return to baseline UC:   irregular, every 3-5  Minutes, IUPC in place.  SVE:   Dilation: Lip/rim Effacement (%): 100 Station: 0 Exam by:: Dr Juliene Pina IUPC, FSE in place, increase pitocin  Assessment / Plan: Protracted active phase 9 cm at 9.45 am and lip after 2 hrs. Continue to increase pitocin  Labor: protracted, direct OP Fetal Wellbeing:  Category II Pain Control:  Epidural Anticipated MOD:  Persistant OP, protracted labor.   Mclean Moya R 03/07/2012, 12:16 PM

## 2012-03-07 NOTE — H&P (Signed)
Diana Mullen is a 31 y.o. female presenting at 38.3/7 wks in active labor. Healthy woman, G1. Uncomplicated pregnancy except excessive wt gain and polyhydramnios. [facility], from 8 wks.  No GDM, no fetal anomalies noted.  Maternal Medical History:  Reason for admission: Reason for admission: contractions.  Contractions: Frequency: regular.   Perceived severity is moderate.    Fetal activity: Perceived fetal activity is normal.   Last perceived fetal movement was within the past hour.    Prenatal complications: Polyhydramnios.   No preterm labor.    No leaking fluid or vaginal bleeding.   OB History    Grav Para Term Preterm Abortions TAB SAB Ect Mult Living   1              Past Medical History  Diagnosis Date  . No pertinent past medical history    History reviewed. No pertinent past surgical history. Family History: family history is negative for Other. Social History:  reports that she has never smoked. She does not have any smokeless tobacco history on file. She reports that she does not drink alcohol or use illicit drugs.   Prenatal Transfer Tool  Maternal Diabetes: No Genetic Screening: Normal Maternal Ultrasounds/Referrals: Normal Fetal Ultrasounds or other Referrals:  Referred to Materal Fetal Medicine  - genetic disorder on husband's side (second cousin), s/p genetic counseling and normal MFM sono.  Maternal Substance Abuse:  No Significant Maternal Medications:  None Significant Maternal Lab Results:  None Other Comments:  None  Review of Systems  Constitutional: Negative for fever.  Eyes: Negative for blurred vision.  Respiratory: Negative for cough and shortness of breath.   Cardiovascular: Negative for chest pain.  Genitourinary: Negative for dysuria.  Neurological: Negative for headaches.    Exam Physical Exam  Blood pressure 115/75, pulse 106, temperature 98.1 F (36.7 C), temperature source Oral, resp. rate 16, height 5\' 3"  (1.6 m), weight 170  lb (77.111 kg), last menstrual period 06/12/2011. A&O x 3, no acute distress. Pleasant HEENT neg, no thyromegaly Lungs CTA bilat CV RRR, S1S2 normal Abdo soft, non tender, non acute Extr no edema/ tenderness Pelvic Cx 3.5/ 100/ -2/ VTX, Controlled AROM done.  FHT  140s/ + accels/ no decels/ moderate variab- category I Toco q 3-4 min  Prenatal labs: ABO, Rh: A/Positive/-- (01/14 0000) Antibody: Negative (01/14 0000) Rubella: Immune (01/14 0000) RPR: Nonreactive (01/14 0000)  HBsAg: Negative (01/14 0000)  HIV: Non-reactive (01/14 0000)  GBS: Negative (07/15 0000)  1 hr Glucola nl 72 Genetic screen - AFP4  normal  Anatomy sono - with MFM, normal with normal interval growth but polyhydramnios noted at term  Assessment/Plan: 31 yo, G1 at 38.3/7 wks, polyhydramnios, active labor. EFW 7 lbs, narrow pelvis, at risk for CPD, will watch progress.  Controlled AROM done, clear fluid. GBS negative Epidural ok. Pitocin if UCS space out.     Demar Shad R 03/07/2012, 12:21 AM

## 2012-03-07 NOTE — Preoperative (Signed)
Beta Blockers   Reason not to administer Beta Blockers:Not Applicable 

## 2012-03-07 NOTE — Anesthesia Postprocedure Evaluation (Signed)
  Anesthesia Post-op Note  Patient: Diana Mullen  Procedure(s) Performed: Procedure(s) (LRB): CESAREAN SECTION (N/A)  Patient Location: PACU  Anesthesia Type: Epidural  Level of Consciousness: awake, alert  and oriented  Airway and Oxygen Therapy: Patient Spontanous Breathing  Post-op Pain: mild  Post-op Assessment: Post-op Vital signs reviewed, Patient's Cardiovascular Status Stable, Respiratory Function Stable, Patent Airway, No signs of Nausea or vomiting, Pain level controlled, No headache and No backache  Post-op Vital Signs: Reviewed and stable  Complications: No apparent anesthesia complications

## 2012-03-07 NOTE — OR Nursing (Addendum)
Uterus massaged by S. Leverne Amrhein Charity fundraiser. Two tubes of cord blood to lab. Foley catheter in upon arrival to OR. Urine color-slightly concentrated.  40cc of blood evacuated from uterus during uterine massage.

## 2012-03-07 NOTE — Anesthesia Preprocedure Evaluation (Signed)

## 2012-03-07 NOTE — Op Note (Signed)
Primary Low Transverse Cesarean Section Procedure Note 03/07/2012  Diana Mullen  Indications: 38.3/7 wks, active labor arrest at 9 cm for 3 hours despite adequate contractions.   Pre-operative Diagnosis: Arrest of dilatation. Term pregancy  Post-operative Diagnosis: Same   Surgeon:  Robley Fries, MD    Assistants: none  Anesthesia: epidural   Procedure Details:  The patient was at 9 cm to anterior lip for about 3 hrs with adequate uterine contractions and persistent OP position. Cesarean delivery was advised. The risks, benefits, complications, treatment options, and expected outcomes were discussed with the patient. The patient concurred with the proposed plan, giving informed consent. identified as Diana Mullen and the procedure verified as C-Section Delivery. She was brought to the operating room and received 2 fm Ancef. A Time Out was held and the above information confirmed. After induction of surgical epidural anesthesia, the patient was prepped and draped in the usual sterile manner. A Pfannenstiel incision was made and carried down through the subcutaneous tissue to the fascia. Fascial incision was made and extended transversely. The fascia was separated from the underlying rectus tissue superiorly and inferiorly. The peritoneum was identified and entered. Peritoneal incision was extended longitudinally. The utero-vesical peritoneal reflection was incised transversely. A low transverse uterine incision was made. Delivered from Occiput Posterior position healthy Female infant was delivered without complications with Apgar scores of 9 and 9 at 1 and 5 min. Cord ph was not indicated. The umbilical cord was clamped and cut, cord blood was obtained for evaluation. The placenta was removed Intact and appeared normal, cord had 3 vessels. The uterine outline, tubes and ovaries appeared normal. Uterine interior was cleaned. The uterine incision was closed with running locked sutures of 0Vicryl in 2  layers.  Hemostasis was observed. Peritoneal gutters were cleaned. Peritoneal edges closed with 2-0 Vicryl. Muscles approximated in lower abdomen with single stitch. The fascia was then reapproximated with running sutures of 0Vicryl. The subcuticular closure was performed using 3-0Vicryl. Steristrips applied and sterile dressing placed.  Instrument, sponge, and needle counts were correct prior the abdominal closure and were correct at the conclusion of the case.    Findings: Baby BOY in direct OP position. Delivered cephalic. Apgars 9 and 9, weight pending. Normal hysterotomy edges without extension. Normal tubes and ovaries. Normal placenta and 3 vessel cord.    Estimated Blood Loss: 1000 cc   Total IV Fluids: 3000 cc LR  Urine Output: In foley, 300 cc, blood tinged  Specimens: Placenta to pathology  Complications: no complications  Disposition: PACU - hemodynamically stable.   Maternal Condition: stable   Baby condition / location:  with mother  Attending Attestation: I performed the procedure.   Signed: Surgeon(s): Robley Fries, MD

## 2012-03-07 NOTE — Anesthesia Procedure Notes (Signed)

## 2012-03-07 NOTE — Progress Notes (Signed)
Diana Mullen is a 31 y.o. G1P0 at [redacted]w[redacted]d in active labor with lot of rectal pain and back pain, baby in OP position not changing despite good UCs and turning position. Epidural bolus for symptom relief. No HAs/ vision changes, SOB  Objective: BP 153/88  Pulse 133  Temp 99.4 F (37.4 C) (Oral)  Resp 18  Ht 5\' 3"  (1.6 m)  Wt 170 lb (77.111 kg)  BMI 30.11 kg/m2  SpO2 100%  LMP 06/12/2011 I/O last 3 completed shifts: In: -  Out: 900 [Urine:900] Total I/O In: -  Out: 800 [Urine:800]  FHT:  FHR: 150s bpm, variability: minimal ,  accelerations:  Present,  decelerations:  Present intermittent variable decels UC:   regular, every 3-4 minutes, adequate with IUPC SVE:   Dilation: Lip/rim Effacement (%): 100 Station: 0 Exam by:: Dr Carron Brazen manually reduce lip/ still at 9-9.5 cm, direct OP. Cannot manually rotate head either.   Assessment / Plan: Persistant OP with arrest of dilatation at 9-9.5 cm for 3 hrs with adequate UCs and failed rotation.  Plan c/secton, reviewd risks/ complications, patient understands and agrees.  OR not available, but since FHT category I after stopping pitocin, will wait and not call 2nd call team.   Risks/complications of surgery reviewed incl infection, bleeding, damage to internal organs including bladder, bowels, ureters, blood vessels, other risks from anesthesia, VTE and delayed complications of any surgery, complications in future surgery reviewed. Also discussed neonatal complications incl difficult delivery, laceration, vacuum assistance, TTN etc. Pt understands and agrees, all concerns addressed.     Jerard Bays R 03/07/2012, 1:34 PM

## 2012-03-07 NOTE — Progress Notes (Signed)
Received report from Verne Grain RN

## 2012-03-07 NOTE — Transfer of Care (Signed)
Immediate Anesthesia Transfer of Care Note  Patient: Diana Mullen  Procedure(s) Performed: Procedure(s) (LRB): CESAREAN SECTION (N/A)  Patient Location: PACU  Anesthesia Type: Epidural  Level of Consciousness: awake, alert , oriented and patient cooperative  Airway & Oxygen Therapy: Patient Spontanous Breathing  Post-op Assessment: Report given to PACU RN  Post vital signs: Reviewed and stable  Complications: No apparent anesthesia complications

## 2012-03-08 ENCOUNTER — Encounter (HOSPITAL_COMMUNITY): Payer: Self-pay | Admitting: Obstetrics and Gynecology

## 2012-03-08 LAB — CBC
MCH: 29.5 pg (ref 26.0–34.0)
MCHC: 32.1 g/dL (ref 30.0–36.0)
MCV: 91.9 fL (ref 78.0–100.0)
Platelets: 198 10*3/uL (ref 150–400)
RBC: 2.95 MIL/uL — ABNORMAL LOW (ref 3.87–5.11)
RDW: 14.3 % (ref 11.5–15.5)

## 2012-03-08 MED ORDER — FERROUS FUMARATE 325 (106 FE) MG PO TABS
1.0000 | ORAL_TABLET | Freq: Two times a day (BID) | ORAL | Status: DC
  Administered 2012-03-09 – 2012-03-10 (×3): 106 mg via ORAL
  Filled 2012-03-08 (×6): qty 1

## 2012-03-08 NOTE — Anesthesia Postprocedure Evaluation (Signed)
  Anesthesia Post-op Note  Patient: Diana Mullen  Procedure(s) Performed: Procedure(s) (LRB): CESAREAN SECTION (N/A)  Patient Location: Mother/Baby  Anesthesia Type: Epidural  Level of Consciousness: awake, alert  and oriented  Airway and Oxygen Therapy: Patient Spontanous Breathing  Post-op Pain: mild  Post-op Assessment: Patient's Cardiovascular Status Stable and Respiratory Function Stable  Post-op Vital Signs: Reviewed and stable  Complications: No apparent anesthesia complications

## 2012-03-08 NOTE — Addendum Note (Signed)
Addendum  created 03/08/12 0820 by Earmon Phoenix, CRNA   Modules edited:Charges VN, Notes Section

## 2012-03-09 ENCOUNTER — Encounter (HOSPITAL_COMMUNITY): Payer: Self-pay

## 2012-03-09 DIAGNOSIS — IMO0002 Reserved for concepts with insufficient information to code with codable children: Secondary | ICD-10-CM

## 2012-03-09 HISTORY — DX: Reserved for concepts with insufficient information to code with codable children: IMO0002

## 2012-03-09 NOTE — Progress Notes (Signed)
Subjective: POD# 2 Information for the patient's newborn:  Tansy, Lorek [[id number]]  female  / circ declines  Reports feeling well, working on breastfeeding / poor latch and LBW Feeding: breast Patient reports tolerating PO.  Breast symptoms: + clolostrum, pumping Pain controlled withMotrin and Percocet Denies HA/SOB/C/P/N/V/dizziness. Flatus absent, not ambulated in hallway yet. She reports vaginal bleeding as normal, without clots.  She is urinating without difficult.     Objective:   VS:  Filed Vitals:   03/08/12 1400 03/08/12 1415 03/08/12 2255 03/09/12 0518  BP: 127/71 103/57 93/62 95/64   Pulse: 84 90 80 69  Temp: 98.1 F (36.7 C) 97.9 F (36.6 C) 97.8 F (36.6 C) 98.4 F (36.9 C)  TempSrc: Oral Oral Oral Oral  Resp: 18 16 18 18   Height:      Weight:      SpO2: 99%        Intake/Output Summary (Last 24 hours) at 03/09/12 1000 Last data filed at 03/08/12 1130  Gross per 24 hour  Intake 896.25 ml  Output    550 ml  Net 346.25 ml        Basename 03/08/12 0520 03/06/12 2310  WBC 13.6* 14.5*  HGB 8.7* 11.5*  HCT 27.1* 35.2*  PLT 198 231     Blood type: --/--/A POS (08/03 2310)  Rubella: Immune (01/14 0000)     Physical Exam:  General: alert, cooperative and no distress CV: Regular rate and rhythm Resp: clear Abdomen: Hypoactive BS, + moderate distention  Incision: clean, dry, intact and steri strips over SQ suture Uterine Fundus: firm, below umbilicus, nontender Lochia: minimal Ext: Homans sign is negative, no sign of DVT and no edema, redness or tenderness in the calves or thighs      Assessment/Plan: 31 y.o.   POD# 2.  s/p Cesarean Delivery.  Indications: failure to progress                Principal Problem:  *CS 03/07/12 M Active Problems:  Active labor  Maternal anemia complicating pregnancy, childbirth, or the puerperium  Started on oral Fe supplement  Doing well, stable.     Ambulate, warm PO fluids to encourage gut motility LC support  for LBW infant and poor latch Routine post-op care Anticipate discharge home in AM.   Nazir Hacker 03/09/2012, 10:00 AM

## 2012-03-09 NOTE — Progress Notes (Signed)
Patient ID: Diana Mullen, female   DOB: 1980-10-31, 31 y.o.   MRN: [id number] POD # 1 (Late entry)  Subjective: Pt reports feeling well; tired/ Pain controlled with ibuprofen and percocet Tolerating po/ Foley d/c'ed/Voiding without problems/ No n/v/Flatus neg Activity: out of bed and ambulate/Denies dizziness, SOB, HA, CP Bleeding is light Newborn info:  Information for the patient's newborn:  Paulla, Mcclaskey [[id number]]  female  / circ not planned/ Feeding: breast   Objective: VS: Blood pressure 95/64, pulse 69, temperature 98.4 F (36.9 C), temperature source Oral, resp. rate 18    Intake/Output Summary (Last 24 hours) at 03/09/12 1018 Last data filed at 03/08/12 1130  Gross per 24 hour  Intake 896.25 ml  Output    550 ml  Net 346.25 ml      Basename 03/08/12 0520 03/06/12 2310  WBC 13.6* 14.5*  HGB 8.7* 11.5*  HCT 27.1* 35.2*  PLT 198 231    Blood type: --/--/A POS (08/03 2310) Rubella: Immune (01/14 0000)    Physical Exam:  General: alert, cooperative and no distress CV: Regular rate and rhythm Resp: clear Abdomen: soft, nontender, normal bowel sounds Incision: covered with dsg; clean dry and intact Uterine Fundus: firm, below umbilicus, nontender Lochia: minimal Ext: edema trace and Homans sign is negative, no sign of DVT    A/P: POD # 1/ G1P1001 S/P C/Section d/t arrest of dilatation/38wks ABL Anemia; asymptomatic Doing well Continue routine post op orders   Signed: Demetrius Revel, MSN, [facility] 03/09/2012, 10:18 AM

## 2012-03-10 LAB — CBC
HCT: 29.4 % — ABNORMAL LOW (ref 36.0–46.0)
Hemoglobin: 9.4 g/dL — ABNORMAL LOW (ref 12.0–15.0)
MCH: 29.8 pg (ref 26.0–34.0)
MCHC: 32 g/dL (ref 30.0–36.0)
MCV: 93.3 fL (ref 78.0–100.0)
Platelets: 228 10*3/uL (ref 150–400)
RBC: 3.15 MIL/uL — ABNORMAL LOW (ref 3.87–5.11)
RDW: 13.9 % (ref 11.5–15.5)
WBC: 7.4 10*3/uL (ref 4.0–10.5)

## 2012-03-10 MED ORDER — IBUPROFEN 600 MG PO TABS: 600.0000 mg | ORAL_TABLET | Freq: Four times a day (QID) | ORAL | Status: AC | PRN

## 2012-03-10 MED ORDER — OXYCODONE-ACETAMINOPHEN 5-325 MG PO TABS: 1.0000 | ORAL_TABLET | Freq: Four times a day (QID) | ORAL | Status: AC | PRN

## 2012-03-10 MED ORDER — BISACODYL 10 MG RE SUPP
10.0000 mg | Freq: Once | RECTAL | Status: AC
  Administered 2012-03-10: 10 mg via RECTAL
  Filled 2012-03-10: qty 1

## 2012-03-10 NOTE — Discharge Summary (Signed)
Obstetric Discharge Summary Reason for Admission: onset of labor @ 38wks 3d Prenatal Procedures: NST and ultrasound Intrapartum Procedures: cesarean: low cervical, transverse Postpartum Procedures: none Complications-Operative and Postpartum: none Hemoglobin  Date Value Range Status  03/10/2012 9.4* 12.0 - 15.0 g/dL Final     HCT  Date Value Range Status  03/10/2012 29.4* 36.0 - 46.0 % Final    Physical Exam:  General: alert, cooperative and no distress Lochia: appropriate Uterine Fundus: firm Incision: healing well DVT Evaluation: No evidence of DVT seen on physical exam.  Discharge Diagnoses: Term Pregnancy-delivered  Discharge Information: Date: 03/10/2012 Activity: pelvic rest Diet: routine Medications: Ibuprofen, Colace, Iron and Percocet Condition: stable Instructions: refer to practice specific booklet Discharge to: home Follow-up Information    Follow up with MODY,Diana Mullen in 6 weeks.   Contact information:   [address] [place] [address] [phone]          Newborn Data: Live born female on 03/07/12 Birth Weight: 6 lb 3.7 oz (2825 g) APGAR: 9, 9  Home with mother.  Diana Mullen K 03/10/2012, 11:09 AM

## 2012-03-10 NOTE — Progress Notes (Signed)
Patient ID: Diana Mullen, female   DOB: [birth year], 31 y.o.   MRN: [id number] POD # 3  Subjective: Pt reports feeling better since Dulcolax supp administered/ Pain controlled with motrin and percocet Tolerating po/Voiding without problems/ No n/v/Flatus pos with pos BS Activity: out of bed and ambulate Bleeding is moderate Newborn info:  Information for the patient's newborn:  Diana, Mullen [[id number]]  female  / circ n/a/ Feeding: breast   Objective: VS: Blood pressure 92/69, pulse 71, temperature 97.9 F (36.6 C), temperature source Oral, resp. rate 18, height 5\' 3"  (1.6 m), weight 77.111 kg (170 lb), last menstrual period 06/12/2011, SpO2 99.00%, unknown if currently breastfeeding.   LABS:  Basename 03/10/12 0940 03/08/12 0520  WBC 7.4 13.6*  HGB 9.4* 8.7*  HCT 29.4* 27.1*  PLT 228 198     Physical Exam:  General: alert, cooperative and no distress CV: Regular rate and rhythm Resp: clear Abdomen: soft, nontender, normal bowel sounds Incision: healing well, well approximated, benzoin and steri strips reapplied after pt removed them Uterine Fundus: firm, below umbilicus, nontender Lochia: moderate Ext: edema trace and Homans sign is negative, no sign of DVT    A/P: POD # 3/ G1P1001/ S/P C/Section d/t failure to progress ABL anemia Doing well and stable for discharge home RX's: Ibuprofen 600mg  po Q 6 hrs prn pain #30 Refill x 1 Percocet 5/325 1 - 2 tabs po every 6 hrs prn pain  #30 No refill Niferex 150mg  po QD/BID #30/#60 Refill x 1    Signed: Demetrius Revel, MSN, [facility] 03/10/2012, 10:48 AM

## 2012-03-10 NOTE — Discharge Summary (Signed)
MD note Pt seen, Reviewed and agree with note --V.Crystel Demarco,

## 2013-09-21 ENCOUNTER — Encounter (HOSPITAL_BASED_OUTPATIENT_CLINIC_OR_DEPARTMENT_OTHER): Payer: Self-pay | Admitting: Emergency Medicine

## 2013-09-21 ENCOUNTER — Emergency Department (HOSPITAL_BASED_OUTPATIENT_CLINIC_OR_DEPARTMENT_OTHER)
Admission: EM | Admit: 2013-09-21 | Discharge: 2013-09-21 | Disposition: A | Discharge status: Home / Self Care | Payer: BC Managed Care – PPO | Attending: Emergency Medicine | Admitting: Emergency Medicine

## 2013-09-21 DIAGNOSIS — R42 Dizziness and giddiness: Secondary | ICD-10-CM | POA: Insufficient documentation

## 2013-09-21 DIAGNOSIS — R630 Anorexia: Secondary | ICD-10-CM | POA: Insufficient documentation

## 2013-09-21 DIAGNOSIS — J029 Acute pharyngitis, unspecified: Secondary | ICD-10-CM | POA: Insufficient documentation

## 2013-09-21 DIAGNOSIS — R1084 Generalized abdominal pain: Secondary | ICD-10-CM | POA: Insufficient documentation

## 2013-09-21 DIAGNOSIS — Z79899 Other long term (current) drug therapy: Secondary | ICD-10-CM | POA: Insufficient documentation

## 2013-09-21 DIAGNOSIS — R112 Nausea with vomiting, unspecified: Secondary | ICD-10-CM | POA: Insufficient documentation

## 2013-09-21 DIAGNOSIS — Z792 Long term (current) use of antibiotics: Secondary | ICD-10-CM | POA: Insufficient documentation

## 2013-09-21 DIAGNOSIS — Z3202 Encounter for pregnancy test, result negative: Secondary | ICD-10-CM | POA: Insufficient documentation

## 2013-09-21 DIAGNOSIS — M2550 Pain in unspecified joint: Secondary | ICD-10-CM | POA: Insufficient documentation

## 2013-09-21 DIAGNOSIS — E860 Dehydration: Secondary | ICD-10-CM | POA: Insufficient documentation

## 2013-09-21 DIAGNOSIS — R000 Tachycardia, unspecified: Secondary | ICD-10-CM | POA: Insufficient documentation

## 2013-09-21 LAB — COMPREHENSIVE METABOLIC PANEL
ALBUMIN: 3.9 g/dL (ref 3.5–5.2)
ALT: 32 U/L (ref 0–35)
AST: 31 U/L (ref 0–37)
Alkaline Phosphatase: 92 U/L (ref 39–117)
BILIRUBIN TOTAL: 0.5 mg/dL (ref 0.3–1.2)
BUN: 7 mg/dL (ref 6–23)
CALCIUM: 9.6 mg/dL (ref 8.4–10.5)
CHLORIDE: 101 meq/L (ref 96–112)
CO2: 25 mEq/L (ref 19–32)
CREATININE: 0.6 mg/dL (ref 0.50–1.10)
GFR calc Af Amer: >90 mL/min (ref >90)
GFR calc non Af Amer: >90 mL/min (ref >90)
Glucose, Bld: 100 mg/dL — ABNORMAL HIGH (ref 70–99)
Potassium: 4.1 mEq/L (ref 3.7–5.3)
Sodium: 138 mEq/L (ref 137–147)
Total Protein: 8.6 g/dL — ABNORMAL HIGH (ref 6.0–8.3)

## 2013-09-21 LAB — CBC WITH DIFFERENTIAL/PLATELET
BASOS PCT: 0 % (ref 0–1)
Basophils Absolute: 0 10*3/uL (ref 0.0–0.1)
EOS ABS: 0 10*3/uL (ref 0.0–0.7)
EOS PCT: 0 % (ref 0–5)
HEMATOCRIT: 38 % (ref 36.0–46.0)
Hemoglobin: 12.1 g/dL (ref 12.0–15.0)
Lymphocytes Relative: 16 % (ref 12–46)
Lymphs Abs: 1.3 10*3/uL (ref 0.7–4.0)
MCH: 28.1 pg (ref 26.0–34.0)
MCHC: 31.8 g/dL (ref 30.0–36.0)
MCV: 88.2 fL (ref 78.0–100.0)
MONO ABS: 0.7 10*3/uL (ref 0.1–1.0)
Monocytes Relative: 9 % (ref 3–12)
NEUTROS ABS: 6.2 10*3/uL (ref 1.7–7.7)
Neutrophils Relative %: 75 % (ref 43–77)
Platelets: 186 10*3/uL (ref 150–400)
RBC: 4.31 MIL/uL (ref 3.87–5.11)
RDW: 12.9 % (ref 11.5–15.5)
WBC: 8.3 10*3/uL (ref 4.0–10.5)

## 2013-09-21 LAB — PREGNANCY, URINE: Preg Test, Ur: NEGATIVE

## 2013-09-21 LAB — URINALYSIS, ROUTINE W REFLEX MICROSCOPIC
Bilirubin Urine: NEGATIVE
Glucose, UA: NEGATIVE mg/dL
KETONES UR: NEGATIVE mg/dL
NITRITE: NEGATIVE
PH: 7 (ref 5.0–8.0)
PROTEIN: NEGATIVE mg/dL
Specific Gravity, Urine: 1.019 (ref 1.005–1.030)
Urobilinogen, UA: 1 mg/dL (ref 0.0–1.0)

## 2013-09-21 LAB — URINE MICROSCOPIC-ADD ON

## 2013-09-21 LAB — LIPASE, BLOOD: LIPASE: 25 U/L (ref 11–59)

## 2013-09-21 LAB — MONONUCLEOSIS SCREEN: Mono Screen: NEGATIVE

## 2013-09-21 LAB — RAPID STREP SCREEN (MED CTR MEBANE ONLY): STREPTOCOCCUS, GROUP A SCREEN (DIRECT): NEGATIVE

## 2013-09-21 MED ORDER — SODIUM CHLORIDE 0.9 % IV BOLUS (SEPSIS)
1000.0000 mL | Freq: Once | INTRAVENOUS | Status: AC
  Administered 2013-09-21: 1000 mL via INTRAVENOUS

## 2013-09-21 MED ORDER — PENICILLIN G BENZATHINE 1200000 UNIT/2ML IM SUSP
1.2000 10*6.[IU] | Freq: Once | INTRAMUSCULAR | Status: AC
  Administered 2013-09-21: 1.2 10*6.[IU] via INTRAMUSCULAR
  Filled 2013-09-21: qty 2

## 2013-09-21 MED ORDER — ONDANSETRON HCL 4 MG/2ML IJ SOLN
4.0000 mg | Freq: Once | INTRAMUSCULAR | Status: AC
  Administered 2013-09-21: 4 mg via INTRAVENOUS
  Filled 2013-09-21: qty 2

## 2013-09-21 MED ORDER — ONDANSETRON HCL 4 MG PO TABS: 4.0000 mg | ORAL_TABLET | Freq: Four times a day (QID) | ORAL | Status: DC

## 2013-09-21 NOTE — Discharge Instructions (Signed)
Dehydration, Adult Dehydration is when you lose more fluids from the body than you take in. Vital organs like the kidneys, brain, and heart cannot function without a proper amount of fluids and salt. Any loss of fluids from the body can cause dehydration.  CAUSES   Vomiting.  Diarrhea.  Excessive sweating.  Excessive urine output.  Fever. SYMPTOMS  Mild dehydration  Thirst.  Dry lips.  Slightly dry mouth. Moderate dehydration  Very dry mouth.  Sunken eyes.  Skin does not bounce back quickly when lightly pinched and released.  Dark urine and decreased urine production.  Decreased tear production.  Headache. Severe dehydration  Very dry mouth.  Extreme thirst.  Rapid, weak pulse (more than 100 beats per minute at rest).  Cold hands and feet.  Not able to sweat in spite of heat and temperature.  Rapid breathing.  Blue lips.  Confusion and lethargy.  Difficulty being awakened.  Minimal urine production.  No tears. DIAGNOSIS  Your caregiver will diagnose dehydration based on your symptoms and your exam. Blood and urine tests will help confirm the diagnosis. The diagnostic evaluation should also identify the cause of dehydration. TREATMENT  Treatment of mild or moderate dehydration can often be done at home by increasing the amount of fluids that you drink. It is best to drink small amounts of fluid more often. Drinking too much at one time can make vomiting worse. Refer to the home care instructions below. Severe dehydration needs to be treated at the hospital where you will probably be given intravenous (IV) fluids that contain water and electrolytes. HOME CARE INSTRUCTIONS   Ask your caregiver about specific rehydration instructions.  Drink enough fluids to keep your urine clear or pale yellow.  Drink small amounts frequently if you have nausea and vomiting.  Eat as you normally do.  Avoid:  Foods or drinks high in sugar.  Carbonated  drinks.  Juice.  Extremely hot or cold fluids.  Drinks with caffeine.  Fatty, greasy foods.  Alcohol.  Tobacco.  Overeating.  Gelatin desserts.  Wash your hands well to avoid spreading bacteria and viruses.  Only take over-the-counter or prescription medicines for pain, discomfort, or fever as directed by your caregiver.  Ask your caregiver if you should continue all prescribed and over-the-counter medicines.  Keep all follow-up appointments with your caregiver. SEEK MEDICAL CARE IF:  You have abdominal pain and it increases or stays in one area (localizes).  You have a rash, stiff neck, or severe headache.  You are irritable, sleepy, or difficult to awaken.  You are weak, dizzy, or extremely thirsty. SEEK IMMEDIATE MEDICAL CARE IF:   You are unable to keep fluids down or you get worse despite treatment.  You have frequent episodes of vomiting or diarrhea.  You have blood or green matter (bile) in your vomit.  You have blood in your stool or your stool looks black and tarry.  You have not urinated in 6 to 8 hours, or you have only urinated a small amount of very dark urine.  You have a fever.  You faint. MAKE SURE YOU:   Understand these instructions.  Will watch your condition.  Will get help right away if you are not doing well or get worse. Document Released: 07/21/2005 Document Revised: 10/13/2011 Document Reviewed: 03/10/2011 Exit[employer] Patient Information 2014 [employer].  

## 2013-09-21 NOTE — ED Notes (Signed)
Rapid strep swab recollected and sent to lab.

## 2013-09-21 NOTE — ED Provider Notes (Signed)
CSN: [id number]     Arrival date & time 09/21/13  [id number] History   First MD Initiated Contact with Patient 09/21/13 [phone]     Chief Complaint  Patient presents with  . Sore Throat  . Abdominal Pain  . Emesis     (Consider location/radiation/quality/duration/timing/severity/associated sxs/prior Treatment) HPI Comments: Patient developed sore throat 3 nights ago and was seen in urgent care 2 days ago and told she possibly had strep throat. She was given Suprax and Vicodin. Today she developed increasing sore throat with nausea and 2 episodes of vomiting. She has some diffuse abdominal pain. She's had body aches, chills, lightheadedness. Denies any chest pain or shortness of breath. Denies any documented fevers. She denies any other medical problems. No sick contacts. She did not receive a flu shot. She complains of being generally weak and sore and achy all over.  The history is provided by the patient and the spouse.    Past Medical History  Diagnosis Date  . No pertinent past medical history   . CS 03/07/12 M 03/08/2012  . Maternal anemia complicating pregnancy, childbirth, or the puerperium 03/09/2012   Past Surgical History  Procedure Laterality Date  . Cesarean section  03/07/2012    Procedure: CESAREAN SECTION;  Surgeon: Robley FriesVaishali R Mody, MD;  Location: [facility];  Service: Gynecology;  Laterality: N/A;  Primary cesarean section with delivery of baby boy at [id number].    Family History  Problem Relation Age of Onset  . Other Neg Hx    History  Substance Use Topics  . Smoking status: Never Smoker   . Smokeless tobacco: Not on file  . Alcohol Use: No   OB History   Grav Para Term Preterm Abortions TAB SAB Ect Mult Living   1 1 1       1      Review of Systems  Constitutional: Positive for chills, activity change, appetite change and fatigue. Negative for fever.  HENT: Positive for congestion, rhinorrhea and sore throat.   Respiratory: Negative for cough and shortness of breath.    Cardiovascular: Negative for chest pain.  Gastrointestinal: Positive for nausea, vomiting and abdominal pain.  Genitourinary: Negative for dysuria and hematuria.  Musculoskeletal: Positive for arthralgias and myalgias. Negative for back pain.  Neurological: Positive for weakness.  A complete 10 system review of systems was obtained and all systems are negative except as noted in the HPI and PMH.      Allergies  Review of patient's allergies indicates no known allergies.  Home Medications   Current Outpatient Rx  Name  Route  Sig  Dispense  Refill  . cefixime (SUPRAX) 400 MG tablet   Oral   Take 400 mg by mouth daily.         Marland Kitchen. HYDROcodone-acetaminophen (NORCO/VICODIN) 5-325 MG per tablet   Oral   Take 1 tablet by mouth every 4 (four) hours as needed for moderate pain.         . ferrous sulfate 325 (65 FE) MG tablet   Oral   Take 325 mg by mouth daily with breakfast.         . ondansetron (ZOFRAN) 4 MG tablet   Oral   Take 1 tablet (4 mg total) by mouth every 6 (six) hours.   12 tablet   0   . Prenatal Vit-Fe Fumarate-FA (PRENATAL MULTIVITAMIN) TABS   Oral   Take 1 tablet by mouth at bedtime.          BP 93/56  Pulse 101  Temp(Src) 99.9 F (37.7 C) (Oral)  Resp 16  Ht 5\' 5"  (1.651 m)  Wt 135 lb (61.236 kg)  BMI 22.47 kg/m2  SpO2 100%  LMP 09/14/2013 Physical Exam  Constitutional: She is oriented to person, place, and time. She appears well-developed and well-nourished. No distress.  HENT:  Head: Normocephalic and atraumatic.  Mouth/Throat: Oropharynx is clear and moist. No oropharyngeal exudate.  Oropharynx appears clear. There is no asymmetry. There is exudate without asymmetry or tongue elevation.  Eyes: Conjunctivae and EOM are normal. Pupils are equal, round, and reactive to light.  Neck: Normal range of motion. Neck supple.  No meningismus  Cardiovascular: Normal rate and normal heart sounds.   No murmur heard. Tachycardic  Pulmonary/Chest:  Effort normal and breath sounds normal. No respiratory distress.  Abdominal: Soft. There is tenderness. There is no rebound and no guarding.  Mild diffuse tenderness  Musculoskeletal: Normal range of motion. She exhibits no edema and no tenderness.  Neurological: She is alert and oriented to person, place, and time. No cranial nerve deficit. She exhibits normal muscle tone. Coordination normal.  Skin: Skin is warm.    ED Course  Procedures (including critical care time) Labs Review Labs Reviewed  URINALYSIS, ROUTINE W REFLEX MICROSCOPIC - Abnormal; Notable for the following:    APPearance CLOUDY (*)    Hgb urine dipstick SMALL (*)    Leukocytes, UA TRACE (*)    All other components within normal limits  COMPREHENSIVE METABOLIC PANEL - Abnormal; Notable for the following:    Glucose, Bld 100 (*)    Total Protein 8.6 (*)    All other components within normal limits  URINE MICROSCOPIC-ADD ON - Abnormal; Notable for the following:    Squamous Epithelial / LPF FEW (*)    Bacteria, UA MANY (*)    All other components within normal limits  RAPID STREP SCREEN  CULTURE, GROUP A STREP  PREGNANCY, URINE  CBC WITH DIFFERENTIAL  LIPASE, BLOOD  MONONUCLEOSIS SCREEN   Imaging Review No results found.  EKG Interpretation   None       MDM   Final diagnoses:  Pharyngitis  Dehydration    3 day history of sore throat with body aches generalized weakness, chills, abdominal pain and vomiting today. Patient tachycardic on arrival. Abdomen soft without peritoneal signs.  Orthostatics positive. HR to 139 with standing. Patient given IV fluids in the ED. HCG negative.  We'll treat with Bicillin for her pharyngitis. Advised to discontinue Suprax.  Tolerating by mouth. No vomiting in the ED. HR improved to 90s-100s. Abdomen soft on reevaluation. No vomiting in the ED.   BP 93/56  Pulse 101  Temp(Src) 99.9 F (37.7 C) (Oral)  Resp 16  Ht 5\' 5"  (1.651 m)  Wt 135 lb (61.236 kg)  BMI  22.47 kg/m2  SpO2 100%  LMP 09/14/2013   Glynn Octave, MD 09/21/13 1818

## 2013-09-21 NOTE — ED Notes (Signed)
Urine sent to lab and held

## 2013-09-21 NOTE — ED Notes (Signed)
Patient ambulates to and from restroom without difficulty and with no assistance

## 2013-09-21 NOTE — ED Notes (Signed)
Developed a sore throat Sunday, seen by [facility] on Monday and started taking Suprax and Vicodin.  This am developed abdominal pain, increased sore throat and vomiting.

## 2013-09-21 NOTE — ED Notes (Signed)
MD at bedside. 

## 2013-09-23 LAB — CULTURE, GROUP A STREP

## 2013-12-02 ENCOUNTER — Ambulatory Visit (INDEPENDENT_AMBULATORY_CARE_PROVIDER_SITE_OTHER): Payer: BC Managed Care – PPO | Admitting: Emergency Medicine

## 2013-12-02 VITALS — BP 100/70 | HR 92 | Temp 98.1°F | Resp 18 | Ht 63.0 in | Wt 135.8 lb

## 2013-12-02 DIAGNOSIS — J018 Other acute sinusitis: Secondary | ICD-10-CM

## 2013-12-02 DIAGNOSIS — J209 Acute bronchitis, unspecified: Secondary | ICD-10-CM

## 2013-12-02 MED ORDER — HYDROCOD POLST-CHLORPHEN POLST 10-8 MG/5ML PO LQCR: 5.0000 mL | Freq: Two times a day (BID) | ORAL | Status: AC | PRN

## 2013-12-02 MED ORDER — PSEUDOEPHEDRINE-GUAIFENESIN ER 60-600 MG PO TB12: 1.0000 | ORAL_TABLET | Freq: Two times a day (BID) | ORAL | Status: AC

## 2013-12-02 MED ORDER — AMOXICILLIN-POT CLAVULANATE 875-125 MG PO TABS: 1.0000 | ORAL_TABLET | Freq: Two times a day (BID) | ORAL | Status: AC

## 2013-12-02 NOTE — Patient Instructions (Signed)
Bronchitis Bronchitis is inflammation of the airways that extend from the windpipe into the lungs (bronchi). The inflammation often causes mucus to develop, which leads to a cough. If the inflammation becomes severe, it may cause shortness of breath. CAUSES  Bronchitis may be caused by:   Viral infections.   Bacteria.   Cigarette smoke.   Allergens, pollutants, and other irritants.  SIGNS AND SYMPTOMS  The most common symptom of bronchitis is a frequent cough that produces mucus. Other symptoms include:  Fever.   Body aches.   Chest congestion.   Chills.   Shortness of breath.   Sore throat.  DIAGNOSIS  Bronchitis is usually diagnosed through a medical history and physical exam. Tests, such as chest X-rays, are sometimes done to rule out other conditions.  TREATMENT  You may need to avoid contact with whatever caused the problem (smoking, for example). Medicines are sometimes needed. These may include:  Antibiotics. These may be prescribed if the condition is caused by bacteria.  Cough suppressants. These may be prescribed for relief of cough symptoms.   Inhaled medicines. These may be prescribed to help open your airways and make it easier for you to breathe.   Steroid medicines. These may be prescribed for those with recurrent (chronic) bronchitis. HOME CARE INSTRUCTIONS  Get plenty of rest.   Drink enough fluids to keep your urine clear or pale yellow (unless you have a medical condition that requires fluid restriction). Increasing fluids may help thin your secretions and will prevent dehydration.   Only take over-the-counter or prescription medicines as directed by your health care provider.  Only take antibiotics as directed. Make sure you finish them even if you start to feel better.  Avoid secondhand smoke, irritating chemicals, and strong fumes. These will make bronchitis worse. If you are a smoker, quit smoking. Consider using nicotine gum or  skin patches to help control withdrawal symptoms. Quitting smoking will help your lungs heal faster.   Put a cool-mist humidifier in your bedroom at night to moisten the air. This may help loosen mucus. Change the water in the humidifier daily. You can also run the hot water in your shower and sit in the bathroom with the door closed for 5 10 minutes.   Follow up with your health care provider as directed.   Wash your hands frequently to avoid catching bronchitis again or spreading an infection to others.  SEEK MEDICAL CARE IF: Your symptoms do not improve after 1 week of treatment.  SEEK IMMEDIATE MEDICAL CARE IF:  Your fever increases.  You have chills.   You have chest pain.   You have worsening shortness of breath.   You have bloody sputum.  You faint.  You have lightheadedness.  You have a severe headache.   You vomit repeatedly. MAKE SURE YOU:   Understand these instructions.  Will watch your condition.  Will get help right away if you are not doing well or get worse. Document Released: 07/21/2005 Document Revised: 05/11/2013 Document Reviewed: 03/15/2013 [employer] Patient Information 2014 [employer]. Sinusitis Sinusitis is redness, soreness, and swelling (inflammation) of the paranasal sinuses. Paranasal sinuses are air pockets within the bones of your face (beneath the eyes, the middle of the forehead, or above the eyes). In healthy paranasal sinuses, mucus is able to drain out, and air is able to circulate through them by way of your nose. However, when your paranasal sinuses are inflamed, mucus and air can become trapped. This can allow bacteria and other   germs to grow and cause infection. Sinusitis can develop quickly and last only a short time (acute) or continue over a long period (chronic). Sinusitis that lasts for more than 12 weeks is considered chronic.  CAUSES  Causes of sinusitis include:  Allergies.  Structural abnormalities, such as  displacement of the cartilage that separates your nostrils (deviated septum), which can decrease the air flow through your nose and sinuses and affect sinus drainage.  Functional abnormalities, such as when the small hairs (cilia) that line your sinuses and help remove mucus do not work properly or are not present. SYMPTOMS  Symptoms of acute and chronic sinusitis are the same. The primary symptoms are pain and pressure around the affected sinuses. Other symptoms include:  Upper toothache.  Earache.  Headache.  Bad breath.  Decreased sense of smell and taste.  A cough, which worsens when you are lying flat.  Fatigue.  Fever.  Thick drainage from your nose, which often is green and may contain pus (purulent).  Swelling and warmth over the affected sinuses. DIAGNOSIS  Your caregiver will perform a physical exam. During the exam, your caregiver may:  Look in your nose for signs of abnormal growths in your nostrils (nasal polyps).  Tap over the affected sinus to check for signs of infection.  View the inside of your sinuses (endoscopy) with a special imaging device with a light attached (endoscope), which is inserted into your sinuses. If your caregiver suspects that you have chronic sinusitis, one or more of the following tests may be recommended:  Allergy tests.  Nasal culture A sample of mucus is taken from your nose and sent to a lab and screened for bacteria.  Nasal cytology A sample of mucus is taken from your nose and examined by your caregiver to determine if your sinusitis is related to an allergy. TREATMENT  Most cases of acute sinusitis are related to a viral infection and will resolve on their own within 10 days. Sometimes medicines are prescribed to help relieve symptoms (pain medicine, decongestants, nasal steroid sprays, or saline sprays).  However, for sinusitis related to a bacterial infection, your caregiver will prescribe antibiotic medicines. These are  medicines that will help kill the bacteria causing the infection.  Rarely, sinusitis is caused by a fungal infection. In theses cases, your caregiver will prescribe antifungal medicine. For some cases of chronic sinusitis, surgery is needed. Generally, these are cases in which sinusitis recurs more than 3 times per year, despite other treatments. HOME CARE INSTRUCTIONS   Drink plenty of water. Water helps thin the mucus so your sinuses can drain more easily.  Use a humidifier.  Inhale steam 3 to 4 times a day (for example, sit in the bathroom with the shower running).  Apply a warm, moist washcloth to your face 3 to 4 times a day, or as directed by your caregiver.  Use saline nasal sprays to help moisten and clean your sinuses.  Take over-the-counter or prescription medicines for pain, discomfort, or fever only as directed by your caregiver. SEEK IMMEDIATE MEDICAL CARE IF:  You have increasing pain or severe headaches.  You have nausea, vomiting, or drowsiness.  You have swelling around your face.  You have vision problems.  You have a stiff neck.  You have difficulty breathing. MAKE SURE YOU:   Understand these instructions.  Will watch your condition.  Will get help right away if you are not doing well or get worse. Document Released: 07/21/2005 Document Revised: 10/13/2011 Document Reviewed:   08/05/2011 [employer] Patient Information 2014 [employer].  

## 2013-12-02 NOTE — Progress Notes (Signed)
[facility] [address], [place] [id number] [phone]  Date:  12/02/2013   Name:  Diana Mullen   DOB:  1980/08/05   MRN:  [id number]  PCP:  No primary provider on file.    Chief Complaint: Cough   History of Present Illness:  Diana Cordskta Lanphere is a 33 y.o. very pleasant female patient who presents with the following:  Ill 1 week with nasal congestion and mucopurulent drainage, post nasal drip.  No sore throat.  Marked cough productive mucopurulent sputum.  No wheezing.  Shortness of breath at night.  No nausea or vomiting no stool change.  No other sick at home.  No improvement with over the counter medications or other home remedies. Denies other complaint or health concern today.   Patient Active Problem List   Diagnosis Date Noted  . Maternal anemia complicating pregnancy, childbirth, or the puerperium 03/09/2012  . CS 03/07/12 M 03/08/2012  . Active labor 03/07/2012    Past Medical History  Diagnosis Date  . No pertinent past medical history   . CS 03/07/12 M 03/08/2012  . Maternal anemia complicating pregnancy, childbirth, or the puerperium 03/09/2012    Past Surgical History  Procedure Laterality Date  . Cesarean section  03/07/2012    Procedure: CESAREAN SECTION;  Surgeon: Robley FriesVaishali R Mody, MD;  Location: [facility];  Service: Gynecology;  Laterality: N/A;  Primary cesarean section with delivery of baby boy at [id number].     History  Substance Use Topics  . Smoking status: Never Smoker   . Smokeless tobacco: Not on file  . Alcohol Use: No    Family History  Problem Relation Age of Onset  . Other Neg Hx     No Known Allergies  Medication list has been reviewed and updated.  No current outpatient prescriptions on file prior to visit.   No current facility-administered medications on file prior to visit.    Review of Systems:  As per HPI, otherwise negative.    Physical Examination: Filed Vitals:   12/02/13 1428  BP: 100/70  Pulse: 92  Temp: 98.1  F (36.7 C)  Resp: 18   Filed Vitals:   12/02/13 1428  Height: 5\' 3"  (1.6 m)  Weight: 135 lb 12.8 oz (61.598 kg)   Body mass index is 24.06 kg/(m^2). Ideal Body Weight: Weight in (lb) to have BMI = 25: 140.8  GEN: WDWN, NAD, Non-toxic, A & O x 3 HEENT: Atraumatic, Normocephalic. Neck supple. No masses, No LAD. Ears and Nose: No external deformity. CV: RRR, No M/G/R. No JVD. No thrill. No extra heart sounds. PULM: CTA B, no wheezes, crackles, rhonchi. No retractions. No resp. distress. No accessory muscle use. ABD: S, NT, ND, +BS. No rebound. No HSM. EXTR: No c/c/e NEURO Normal gait.  PSYCH: Normally interactive. Conversant. Not depressed or anxious appearing.  Calm demeanor.    Assessment and Plan: Sinusitis Bronchitis augmentin  mucinex  tussionex   Signed,  Phillips OdorJeffery Caris Cerveny, MD

## 2019-08-18 ENCOUNTER — Other Ambulatory Visit: Payer: Self-pay | Admitting: Obstetrics & Gynecology

## 2019-08-18 DIAGNOSIS — N644 Mastodynia: Secondary | ICD-10-CM

## 2019-08-18 DIAGNOSIS — N6002 Solitary cyst of left breast: Secondary | ICD-10-CM

## 2019-09-02 ENCOUNTER — Ambulatory Visit
Admission: RE | Admit: 2019-09-02 | Discharge: 2019-09-02 | Disposition: A | Discharge status: Home / Self Care | Payer: BLUE CROSS/BLUE SHIELD | Source: Ambulatory Visit | Attending: Obstetrics & Gynecology | Admitting: Obstetrics & Gynecology

## 2019-09-02 ENCOUNTER — Other Ambulatory Visit: Payer: Self-pay | Admitting: Obstetrics & Gynecology

## 2019-09-02 ENCOUNTER — Other Ambulatory Visit: Payer: Self-pay

## 2019-09-02 DIAGNOSIS — N6002 Solitary cyst of left breast: Secondary | ICD-10-CM

## 2019-09-02 DIAGNOSIS — N644 Mastodynia: Secondary | ICD-10-CM

## 2020-03-05 ENCOUNTER — Other Ambulatory Visit: Payer: BLUE CROSS/BLUE SHIELD

## 2020-07-04 ENCOUNTER — Other Ambulatory Visit: Payer: BLUE CROSS/BLUE SHIELD

## 2020-07-10 ENCOUNTER — Ambulatory Visit
Admission: RE | Admit: 2020-07-10 | Discharge: 2020-07-10 | Disposition: A | Discharge status: Home / Self Care | Payer: BLUE CROSS/BLUE SHIELD | Source: Ambulatory Visit | Attending: Obstetrics & Gynecology | Admitting: Obstetrics & Gynecology

## 2020-07-10 ENCOUNTER — Other Ambulatory Visit: Payer: Self-pay

## 2020-07-10 DIAGNOSIS — N6002 Solitary cyst of left breast: Secondary | ICD-10-CM

## 2020-07-10 DIAGNOSIS — N644 Mastodynia: Secondary | ICD-10-CM

## 2021-08-02 ENCOUNTER — Other Ambulatory Visit: Payer: Self-pay | Admitting: Obstetrics & Gynecology

## 2021-08-02 DIAGNOSIS — Z09 Encounter for follow-up examination after completed treatment for conditions other than malignant neoplasm: Secondary | ICD-10-CM

## 2021-08-02 DIAGNOSIS — N6320 Unspecified lump in the left breast, unspecified quadrant: Secondary | ICD-10-CM

## 2021-09-09 ENCOUNTER — Ambulatory Visit
Admission: RE | Admit: 2021-09-09 | Discharge: 2021-09-09 | Disposition: A | Discharge status: Home / Self Care | Payer: BLUE CROSS/BLUE SHIELD | Source: Ambulatory Visit | Attending: Obstetrics & Gynecology | Admitting: Obstetrics & Gynecology

## 2021-09-09 ENCOUNTER — Ambulatory Visit
Admission: RE | Admit: 2021-09-09 | Discharge: 2021-09-09 | Disposition: A | Discharge status: Home / Self Care | Payer: BC Managed Care – PPO | Source: Ambulatory Visit | Attending: Obstetrics & Gynecology | Admitting: Obstetrics & Gynecology

## 2021-09-09 DIAGNOSIS — Z09 Encounter for follow-up examination after completed treatment for conditions other than malignant neoplasm: Secondary | ICD-10-CM

## 2021-09-09 DIAGNOSIS — N6320 Unspecified lump in the left breast, unspecified quadrant: Secondary | ICD-10-CM

## 2022-08-13 IMAGING — MG DIGITAL DIAGNOSTIC BILAT W/ TOMO W/ CAD
8 series · 9 of 24 positions shown · non-contrast
Comparison: Prior films

CLINICAL DATA: Short-term follow-up left breast

EXAM:
DIGITAL DIAGNOSTIC BILATERAL MAMMOGRAM WITH TOMOSYNTHESIS AND CAD;
ULTRASOUND LEFT BREAST LIMITED
TECHNIQUE: Bilateral digital diagnostic mammography and breast tomosynthesis
was performed. The images were evaluated with computer-aided
detection.; Targeted ultrasound examination of the left breast was
performed.

[L CC synth-2D]
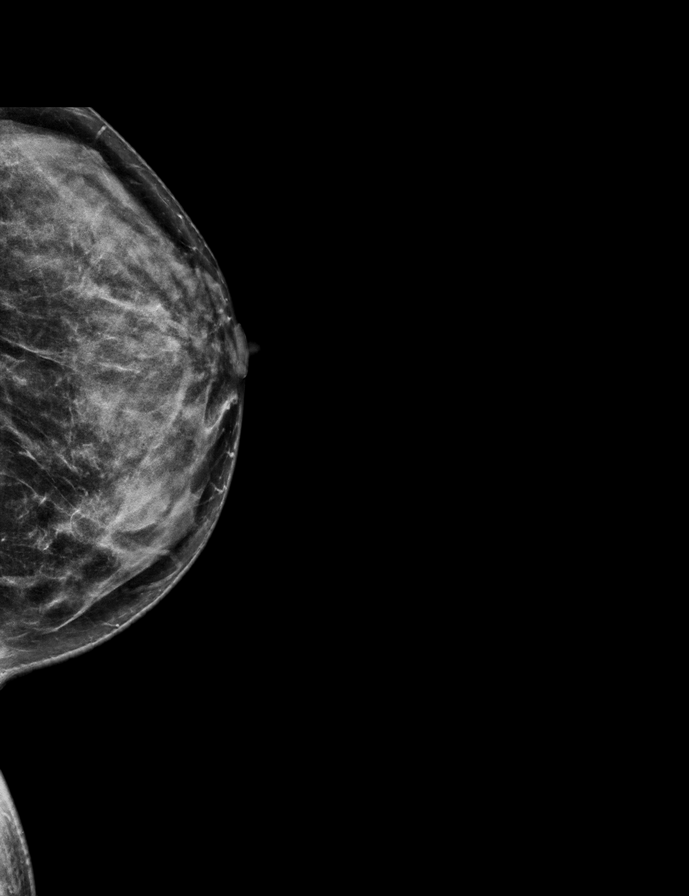

[R MLO synth-2D]
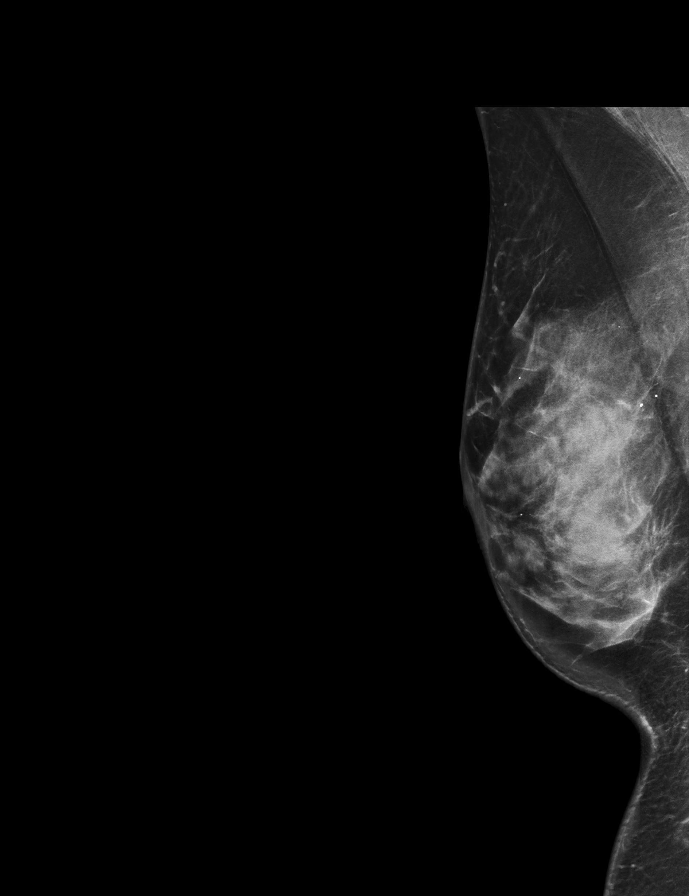

[R CC synth-2D]
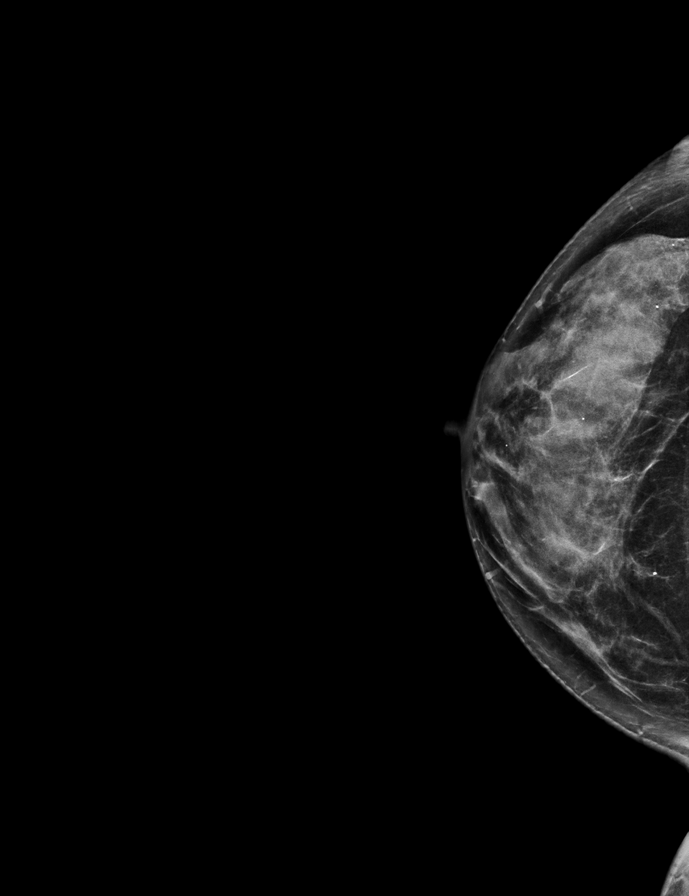

[L MLO synth-2D]
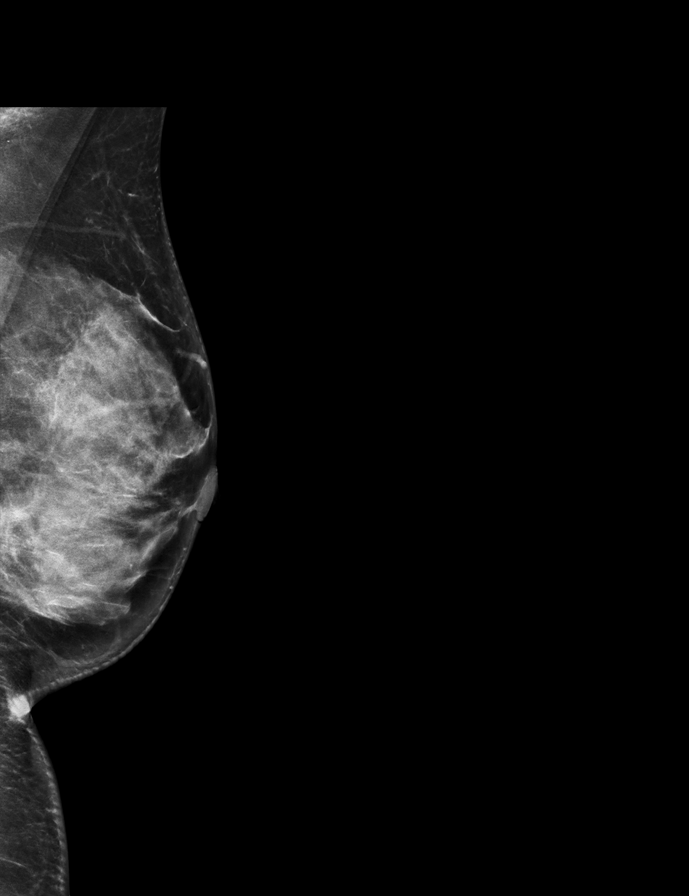

[R CC tomo · 2 of 57 frames shown]
[frame 19/57]
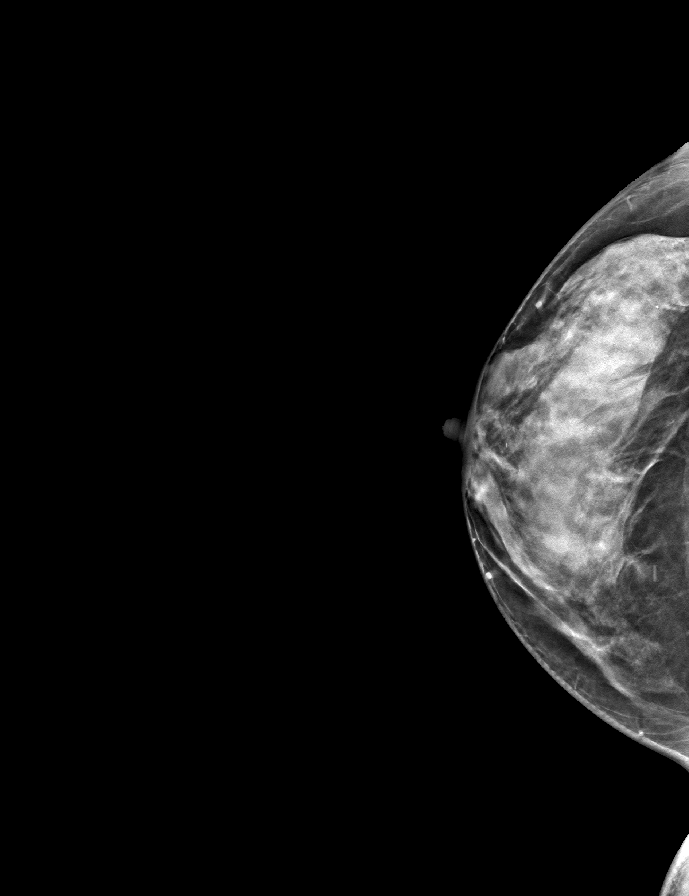
[frame 29/57]
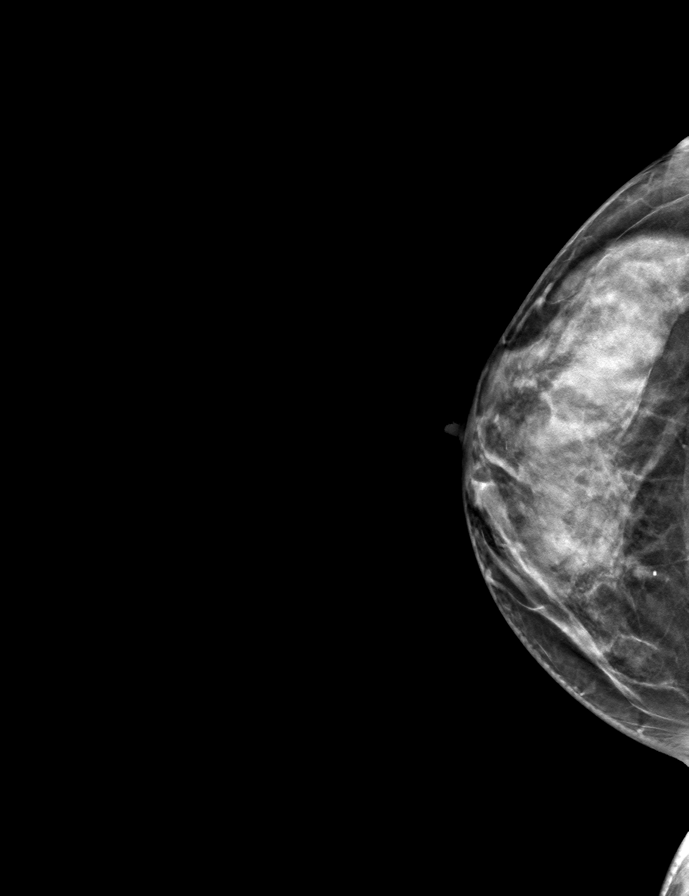

[R MLO tomo · tomo slice 35/70.0]
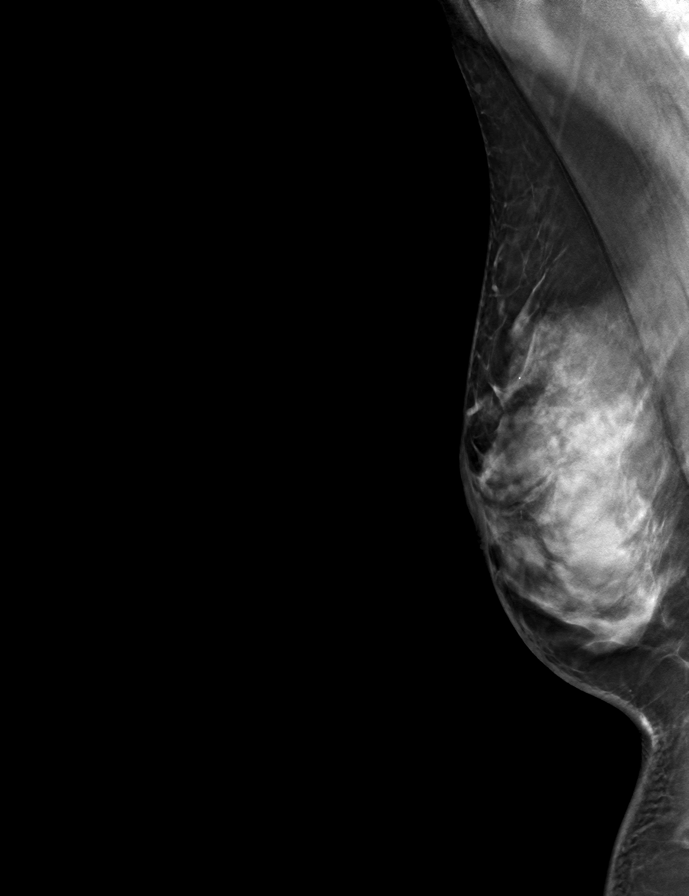

[L CC tomo · tomo slice 28/55.0]
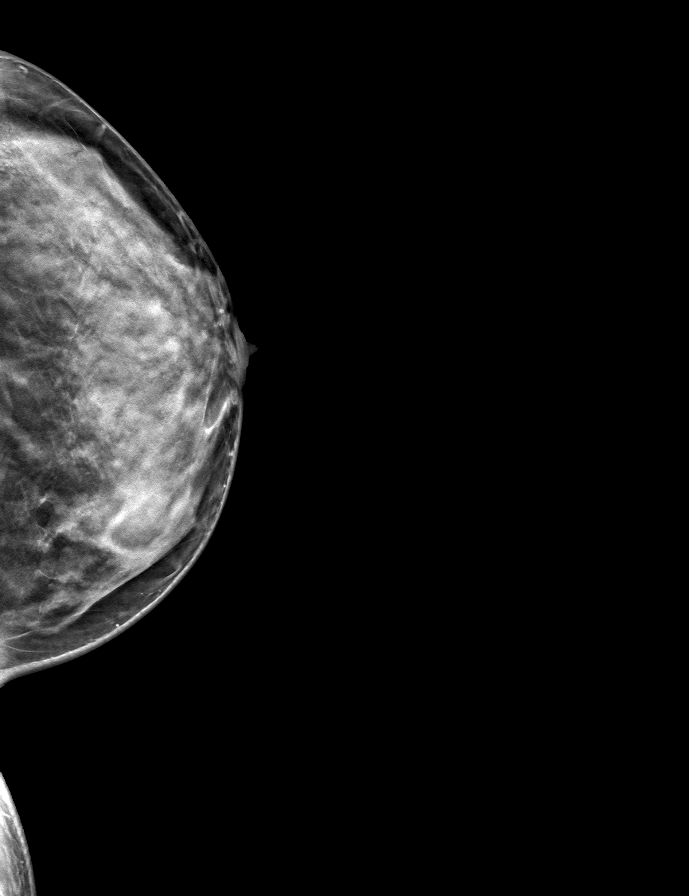

[L MLO tomo · tomo slice 33/65.0]
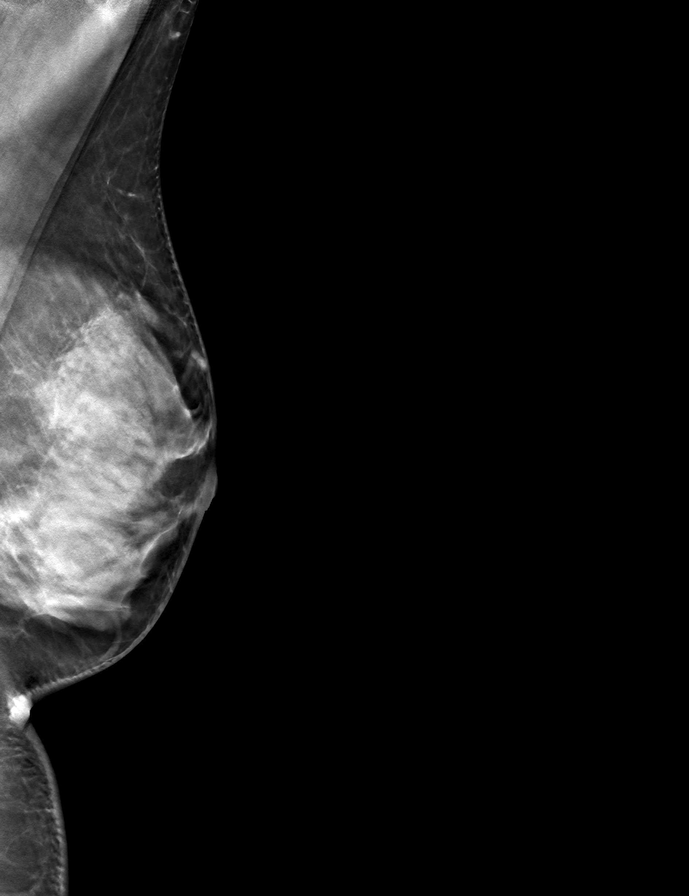

[9 of 24 positions shown; findings below may reference images not displayed]

ACR Breast Density Category d: The breast tissue is extremely dense,
which lowers the sensitivity of mammography.
FINDINGS: Cc and MLO views of bilateral breasts are submitted. No suspicious
abnormalities identified bilaterally.

Targeted ultrasound is performed, showing oval hypoechoic mass at
the left breast 2 o'clock 4 cm from nipple measuring 1.3 x 0.5 x
cm minimally smaller. Two years stability is established.
IMPRESSION: Benign findings.

RECOMMENDATION:
Routine screening 9year.

I have discussed the findings and recommendations with the patient.
If applicable, a reminder letter will be sent to the patient
regarding the next appointment.

BI-RADS CATEGORY  2: Benign.

## 2023-09-21 ENCOUNTER — Other Ambulatory Visit: Payer: Self-pay | Admitting: Obstetrics & Gynecology

## 2023-09-21 DIAGNOSIS — Z1231 Encounter for screening mammogram for malignant neoplasm of breast: Secondary | ICD-10-CM

## 2023-10-02 ENCOUNTER — Ambulatory Visit
Admission: RE | Admit: 2023-10-02 | Discharge: 2023-10-02 | Disposition: A | Discharge status: Home / Self Care | Payer: 59 | Source: Ambulatory Visit | Attending: Obstetrics & Gynecology | Admitting: Obstetrics & Gynecology

## 2023-10-02 DIAGNOSIS — Z1231 Encounter for screening mammogram for malignant neoplasm of breast: Secondary | ICD-10-CM
